# Patient Record
Sex: Male | Born: 1985 | Race: Black or African American | Hispanic: No | Marital: Single | Smoking: Never smoker
Health system: Southern US, Community
[De-identification: ages and names within clinical notes are randomized; demographics above are authoritative.]

---
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
## 2007-01-25 ENCOUNTER — Emergency Department (HOSPITAL_COMMUNITY): Admission: EM | Admit: 2007-01-25 | Discharge: 2007-01-25 | Payer: Self-pay | Admitting: Emergency Medicine

## 2010-09-28 LAB — URINALYSIS, ROUTINE W REFLEX MICROSCOPIC
Bilirubin Urine: NEGATIVE
Glucose, UA: NEGATIVE
Ketones, ur: NEGATIVE
Leukocytes, UA: NEGATIVE
Nitrite: NEGATIVE
Specific Gravity, Urine: 1.02
Urobilinogen, UA: 0.2
pH: 6

## 2010-09-28 LAB — URINE MICROSCOPIC-ADD ON

## 2010-09-28 LAB — BASIC METABOLIC PANEL
BUN: 18
CO2: 27
Chloride: 99
Glucose, Bld: 110 — ABNORMAL HIGH
Potassium: 3.5

## 2010-09-28 LAB — CBC
HCT: 43.1
MCHC: 33.7
MCV: 90.9
Platelets: 240
RDW: 13.8

## 2010-09-28 LAB — DIFFERENTIAL
Basophils Absolute: 0
Eosinophils Absolute: 0
Eosinophils Relative: 0
Lymphs Abs: 1.5

## 2014-10-29 ENCOUNTER — Emergency Department (HOSPITAL_COMMUNITY): Payer: BLUE CROSS/BLUE SHIELD

## 2014-10-29 ENCOUNTER — Emergency Department (HOSPITAL_COMMUNITY)
Admission: EM | Admit: 2014-10-29 | Discharge: 2014-10-29 | Disposition: A | Discharge status: Home / Self Care | Payer: BLUE CROSS/BLUE SHIELD | Attending: Emergency Medicine | Admitting: Emergency Medicine

## 2014-10-29 ENCOUNTER — Encounter (HOSPITAL_COMMUNITY): Payer: Self-pay | Admitting: *Deleted

## 2014-10-29 DIAGNOSIS — S62633A Displaced fracture of distal phalanx of left middle finger, initial encounter for closed fracture: Secondary | ICD-10-CM | POA: Insufficient documentation

## 2014-10-29 DIAGNOSIS — S61315A Laceration without foreign body of left ring finger with damage to nail, initial encounter: Secondary | ICD-10-CM | POA: Insufficient documentation

## 2014-10-29 DIAGNOSIS — Y998 Other external cause status: Secondary | ICD-10-CM | POA: Diagnosis not present

## 2014-10-29 DIAGNOSIS — Y9389 Activity, other specified: Secondary | ICD-10-CM | POA: Insufficient documentation

## 2014-10-29 DIAGNOSIS — S62635B Displaced fracture of distal phalanx of left ring finger, initial encounter for open fracture: Secondary | ICD-10-CM | POA: Insufficient documentation

## 2014-10-29 DIAGNOSIS — W312XXA Contact with powered woodworking and forming machines, initial encounter: Secondary | ICD-10-CM | POA: Diagnosis not present

## 2014-10-29 DIAGNOSIS — Y9289 Other specified places as the place of occurrence of the external cause: Secondary | ICD-10-CM | POA: Insufficient documentation

## 2014-10-29 DIAGNOSIS — S6992XA Unspecified injury of left wrist, hand and finger(s), initial encounter: Secondary | ICD-10-CM | POA: Diagnosis present

## 2014-10-29 DIAGNOSIS — S62639B Displaced fracture of distal phalanx of unspecified finger, initial encounter for open fracture: Secondary | ICD-10-CM

## 2014-10-29 MED ORDER — CEPHALEXIN 500 MG PO CAPS: 500.0000 mg | ORAL_CAPSULE | Freq: Four times a day (QID) | ORAL | Status: DC

## 2014-10-29 MED ORDER — CEPHALEXIN 500 MG PO CAPS
500.0000 mg | ORAL_CAPSULE | Freq: Once | ORAL | Status: AC
  Administered 2014-10-29: 500 mg via ORAL
  Filled 2014-10-29: qty 1

## 2014-10-29 MED ORDER — OXYCODONE-ACETAMINOPHEN 5-325 MG PO TABS
1.0000 | ORAL_TABLET | Freq: Once | ORAL | Status: AC
  Administered 2014-10-29: 1 via ORAL

## 2014-10-29 MED ORDER — OXYCODONE-ACETAMINOPHEN 5-325 MG PO TABS: 1.0000 | ORAL_TABLET | ORAL | Status: DC | PRN

## 2014-10-29 MED ORDER — LIDOCAINE HCL (PF) 2 % IJ SOLN
INTRAMUSCULAR | Status: AC
  Filled 2014-10-29: qty 10

## 2014-10-29 MED ORDER — OXYCODONE-ACETAMINOPHEN 5-325 MG PO TABS
ORAL_TABLET | ORAL | Status: AC
  Filled 2014-10-29: qty 1

## 2014-10-29 NOTE — ED Provider Notes (Signed)
CSN: [id number]     Arrival date & time 10/29/14  1059 History   First MD Initiated Contact with Patient 10/29/14 1114     Chief Complaint  Patient presents with  . Extremity Laceration     (Consider location/radiation/quality/duration/timing/severity/associated sxs/prior Treatment) HPI   Mitchell Hopkins is a 29 y.o. male who presents to the Emergency Department complaining of laceration and crush injury to the left ring finger that occurred shortly prior to ED arrival.  He states that he was using a wood splitter at the time of the injury.  Nothing makes his pain better or worse.  He reports mild bleeding that is controlled with pressure.  He denies swelling, numbness, and pain to the other fingers or wrist.  Last Td is < 5 yrs ago   History reviewed. No pertinent past medical history. History reviewed. No pertinent past surgical history. History reviewed. No pertinent family history. Social History  Substance Use Topics  . Smoking status: Never Smoker   . Smokeless tobacco: None  . Alcohol Use: No    Review of Systems  Constitutional: Negative for fever and chills.  Musculoskeletal: Negative for back pain, joint swelling and arthralgias.  Skin: Positive for wound (left ring finger laceration).       Laceration   Neurological: Negative for dizziness, weakness and numbness.  Hematological: Does not bruise/bleed easily.  All other systems reviewed and are negative.     Allergies  Review of patient's allergies indicates no known allergies.  Home Medications   Prior to Admission medications   Not on File   BP 157/76 mmHg  Pulse 83  Temp(Src) 97.6 F (36.4 C) (Oral)  Resp 18  Ht 5\' 9"  (1.753 m)  Wt 230 lb (104.327 kg)  BMI 33.95 kg/m2  SpO2 98% Physical Exam  Constitutional: He is oriented to person, place, and time. He appears well-developed and well-nourished. No distress.  HENT:  Head: Normocephalic and atraumatic.  Cardiovascular: Normal rate, regular  rhythm, normal heart sounds and intact distal pulses.   No murmur heard. Pulmonary/Chest: Effort normal and breath sounds normal. No respiratory distress.  Musculoskeletal: He exhibits no edema.       Left hand: He exhibits tenderness and laceration. He exhibits normal range of motion, normal capillary refill and no swelling. Normal sensation noted. Normal strength noted. He exhibits no finger abduction and no thumb/finger opposition.       Hands: Laceration to distal left ring finger, nail bed intact, but proximal nail is avulsed.  Bleeding controlled.  Distal sensation intact, Cap refill < 2 sec.  DIP joint is NT  Neurological: He is alert and oriented to person, place, and time. He exhibits normal muscle tone. Coordination normal.  Skin: Skin is warm. Laceration noted.  Nursing note and vitals reviewed.   ED Course  Procedures (including critical care time) Labs Review Labs Reviewed - No data to display  Imaging Review Dg Finger Middle Left  10/29/2014  CLINICAL DATA:  Left ring finger laceration. EXAM: LEFT MIDDLE FINGER 2+V COMPARISON:  None. FINDINGS: Examination demonstrates suggestion of a subtle fracture involving the distal tuft of the middle finger. Mild adjacent soft tissue swelling. Also evidence of a displaced transverse fracture of the distal phalanx of the ring finger. IMPRESSION: Displaced transverse fracture of the distal phalanx of the ring finger. Subtle fracture of the distal tuft of the middle finger. Electronically Signed   By: Elberta Fortisaniel  Boyle M.D.   On: 10/29/2014 12:08   Dg Finger Ring  Left  10/29/2014  CLINICAL DATA:  Laceration LEFT ring finger, cut finger while using a wood splinter EXAM: LEFT RING FINGER 2+V COMPARISON:  None FINDINGS: Soft tissue deformity distal phalanx. Comminuted displaced tuft fracture distal phalanx LEFT ring finger. Osseous mineralization normal. Joint spaces preserved. No extension of fracture to DIP joint. No additional fracture,  dislocation or bone destruction. IMPRESSION: Displaced tuft fracture distal phalanx LEFT ring finger. Electronically Signed   By: Ulyses Southward M.D.   On: 10/29/2014 12:05   I have personally reviewed and evaluated these images and lab results as part of my medical decision-making.    LACERATION REPAIR Performed by: Holt Woolbright L. Authorized by: Maxwell Caul Consent: Verbal consent obtained. Risks and benefits: risks, benefits and alternatives were discussed Consent given by: patient Patient identity confirmed: provided demographic data Prepped and Draped in normal sterile fashion Wound explored  Laceration Location: left ring finger  Laceration Length: 2 cm  No Foreign Bodies seen or palpated  Anesthesia: digital block  Local anesthetic: lidocaine 2% w/o epinephrine  Anesthetic total: 2 ml  Irrigation method: syringe Amount of cleaning: standard  Skin closure: 4-0 ethilon  Number of sutures: 5  Technique: simple interrupted  Patient tolerance: Patient tolerated the procedure well with no immediate complications.   Proximal nail was placed in anatomic position and held in place with one simple interrupted suture through the nail bed  MDM   Final diagnoses:  Open fracture of tuft of distal phalanx of finger, initial encounter    1310  Consulted Dr. Romeo Apple.  Will see pt in his offfice for f/u.    Finger was bandaged, splinted and buddy taped to middle finger.  Pain improved, remains NV intact.  Pt agrees to f/u with Dr. Romeo Apple in his office.  Given strict return precautions.  rx for percocet and keflex    Pauline Aus, PA-C 10/31/14 1700  Jerelyn Scott, MD 11/08/14 2302

## 2014-10-29 NOTE — ED Notes (Signed)
Left ring finger laceration, cut finger while using wood spliter

## 2014-10-29 NOTE — Discharge Instructions (Signed)
Finger Fracture °Finger fractures are breaks in the bones of the fingers. There are many types of fractures. There are also different ways of treating these fractures. Your doctor will talk with you about the best way to treat your fracture. °Injury is the main cause of broken fingers. This includes: °· Injuries while playing sports. °· Workplace injuries. °· Falls. °HOME CARE °· Follow your doctor's instructions for: °¨ Activities. °¨ Exercises. °¨ Physical therapy. °· Take medicines only as told by your doctor for pain, discomfort, or fever. °GET HELP IF: °You have pain or swelling that limits: °· The motion of your fingers. °· The use of your fingers. °GET HELP RIGHT AWAY IF: °· You cannot feel your fingers, or your fingers become numb. °  °This information is not intended to replace advice given to you by your health care provider. Make sure you discuss any questions you have with your health care provider. °  °Document Released: 06/12/2007 Document Revised: 01/14/2014 Document Reviewed: 08/05/2012 °Elsevier Interactive Patient Education ©2016 [employer] ° °

## 2014-11-01 ENCOUNTER — Ambulatory Visit (INDEPENDENT_AMBULATORY_CARE_PROVIDER_SITE_OTHER): Payer: BLUE CROSS/BLUE SHIELD | Admitting: Orthopedic Surgery

## 2014-11-01 ENCOUNTER — Encounter: Payer: Self-pay | Admitting: Orthopedic Surgery

## 2014-11-01 VITALS — BP 155/94 | Ht 69.0 in | Wt 230.0 lb

## 2014-11-01 DIAGNOSIS — S62635A Displaced fracture of distal phalanx of left ring finger, initial encounter for closed fracture: Secondary | ICD-10-CM

## 2014-11-01 DIAGNOSIS — S62633A Displaced fracture of distal phalanx of left middle finger, initial encounter for closed fracture: Secondary | ICD-10-CM
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
## 2014-11-07 ENCOUNTER — Encounter: Payer: Self-pay | Admitting: Orthopedic Surgery

## 2014-11-07 ENCOUNTER — Ambulatory Visit (INDEPENDENT_AMBULATORY_CARE_PROVIDER_SITE_OTHER): Payer: Self-pay | Admitting: Orthopedic Surgery

## 2014-11-07 VITALS — BP 158/99 | Ht 69.0 in | Wt 230.0 lb

## 2014-11-07 DIAGNOSIS — S62635D Displaced fracture of distal phalanx of left ring finger, subsequent encounter for fracture with routine healing: Secondary | ICD-10-CM

## 2014-11-07 DIAGNOSIS — S62633D Displaced fracture of distal phalanx of left middle finger, subsequent encounter for fracture with routine healing: Secondary | ICD-10-CM

## 2014-11-07 NOTE — Progress Notes (Signed)
Patient ID: Mitchell DissChristopher J Hopkins, male   DOB: 01/25/1985, 29 y.o.   MRN: [id number]  Follow up visit  Chief Complaint  Patient presents with  . Follow-up    6 day follow up suture removal left long finger, DOI 10/29/14    BP 158/99 mmHg  Ht 5\' 9"  (1.753 m)  Wt 230 lb (104.327 kg)  BMI 33.95 kg/m2  Encounter Diagnoses  Name Primary?  . Closed displaced fracture of distal phalanx of left ring finger with routine healing, subsequent encounter Yes  . Closed displaced fracture of distal phalanx of left middle finger with routine healing, subsequent encounter      The patient is left long finger tuft fracture we took his sutures out today. Everything looks good we advised him to soak the finger at night 20 minutes at a time warm water, Epson salt, 1 dropped his detergent, return in 2 weeks

## 2014-11-07 NOTE — Patient Instructions (Signed)
Daily soaks for 20 minutes in warm salt water with a drop of dish detergent

## 2014-11-21 ENCOUNTER — Ambulatory Visit (INDEPENDENT_AMBULATORY_CARE_PROVIDER_SITE_OTHER): Payer: Self-pay | Admitting: Orthopedic Surgery

## 2014-11-21 VITALS — BP 154/101 | Ht 69.0 in | Wt 230.0 lb

## 2014-11-21 DIAGNOSIS — S62635D Displaced fracture of distal phalanx of left ring finger, subsequent encounter for fracture with routine healing: Secondary | ICD-10-CM
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
## 2015-08-27 ENCOUNTER — Emergency Department (HOSPITAL_COMMUNITY): Payer: BLUE CROSS/BLUE SHIELD

## 2015-08-27 ENCOUNTER — Encounter (HOSPITAL_COMMUNITY): Payer: Self-pay

## 2015-08-27 ENCOUNTER — Emergency Department (HOSPITAL_COMMUNITY)
Admission: EM | Admit: 2015-08-27 | Discharge: 2015-08-28 | Disposition: A | Discharge status: Home / Self Care | Payer: BLUE CROSS/BLUE SHIELD | Attending: Emergency Medicine | Admitting: Emergency Medicine

## 2015-08-27 DIAGNOSIS — S90811A Abrasion, right foot, initial encounter: Secondary | ICD-10-CM | POA: Insufficient documentation

## 2015-08-27 DIAGNOSIS — S60512A Abrasion of left hand, initial encounter: Secondary | ICD-10-CM | POA: Diagnosis not present

## 2015-08-27 DIAGNOSIS — Z23 Encounter for immunization: Secondary | ICD-10-CM | POA: Diagnosis not present

## 2015-08-27 DIAGNOSIS — K449 Diaphragmatic hernia without obstruction or gangrene: Secondary | ICD-10-CM | POA: Insufficient documentation

## 2015-08-27 DIAGNOSIS — Y999 Unspecified external cause status: Secondary | ICD-10-CM | POA: Diagnosis not present

## 2015-08-27 DIAGNOSIS — S6991XA Unspecified injury of right wrist, hand and finger(s), initial encounter: Secondary | ICD-10-CM | POA: Diagnosis present

## 2015-08-27 DIAGNOSIS — S30810A Abrasion of lower back and pelvis, initial encounter: Secondary | ICD-10-CM | POA: Insufficient documentation

## 2015-08-27 DIAGNOSIS — Y9355 Activity, bike riding: Secondary | ICD-10-CM | POA: Diagnosis not present

## 2015-08-27 DIAGNOSIS — S92332A Displaced fracture of third metatarsal bone, left foot, initial encounter for closed fracture: Secondary | ICD-10-CM | POA: Insufficient documentation

## 2015-08-27 DIAGNOSIS — S93125A Dislocation of metatarsophalangeal joint of left lesser toe(s), initial encounter: Secondary | ICD-10-CM | POA: Insufficient documentation

## 2015-08-27 DIAGNOSIS — Y92410 Unspecified street and highway as the place of occurrence of the external cause: Secondary | ICD-10-CM | POA: Diagnosis not present

## 2015-08-27 DIAGNOSIS — S92322A Displaced fracture of second metatarsal bone, left foot, initial encounter for closed fracture: Secondary | ICD-10-CM | POA: Insufficient documentation

## 2015-08-27 DIAGNOSIS — T148XXA Other injury of unspecified body region, initial encounter: Secondary | ICD-10-CM

## 2015-08-27 DIAGNOSIS — S52591A Other fractures of lower end of right radius, initial encounter for closed fracture: Secondary | ICD-10-CM | POA: Diagnosis not present

## 2015-08-27 DIAGNOSIS — S92342A Displaced fracture of fourth metatarsal bone, left foot, initial encounter for closed fracture: Secondary | ICD-10-CM | POA: Insufficient documentation

## 2015-08-27 LAB — I-STAT CHEM 8, ED
BUN: 25 mg/dL — ABNORMAL HIGH (ref 6–20)
CREATININE: 1.4 mg/dL — AB (ref 0.61–1.24)
Calcium, Ion: 1.21 mmol/L (ref 1.13–1.30)
Chloride: 105 mmol/L (ref 101–111)
Glucose, Bld: 173 mg/dL — ABNORMAL HIGH (ref 65–99)
HEMATOCRIT: 48 % (ref 39.0–52.0)
HEMOGLOBIN: 16.3 g/dL (ref 13.0–17.0)
POTASSIUM: 3.8 mmol/L (ref 3.5–5.1)
Sodium: 141 mmol/L (ref 135–145)
TCO2: 24 mmol/L (ref 0–100)

## 2015-08-27 MED ORDER — SODIUM CHLORIDE 0.9 % IV BOLUS (SEPSIS)
1000.0000 mL | Freq: Once | INTRAVENOUS | Status: AC
  Administered 2015-08-27: 1000 mL via INTRAVENOUS

## 2015-08-27 MED ORDER — LIDOCAINE HCL (PF) 2 % IJ SOLN
INTRAMUSCULAR | Status: DC
Start: 2015-08-27 — End: 2015-08-28
  Filled 2015-08-27: qty 10
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
## 2015-08-28 DIAGNOSIS — S52591A Other fractures of lower end of right radius, initial encounter for closed fracture: Secondary | ICD-10-CM | POA: Diagnosis not present

## 2015-08-28 MED ORDER — IOPAMIDOL (ISOVUE-300) INJECTION 61%
100.0000 mL | Freq: Once | INTRAVENOUS | Status: AC | PRN
  Administered 2015-08-28: 100 mL via INTRAVENOUS

## 2015-08-28 MED ORDER — TRAMADOL HCL 50 MG PO TABS: 50.0000 mg | ORAL_TABLET | Freq: Four times a day (QID) | ORAL | 0 refills | Status: DC | PRN

## 2015-08-28 MED ORDER — TETANUS-DIPHTH-ACELL PERTUSSIS 5-2.5-18.5 LF-MCG/0.5 IM SUSP
0.5000 mL | Freq: Once | INTRAMUSCULAR | Status: AC
  Administered 2015-08-28: 0.5 mL via INTRAMUSCULAR
  Filled 2015-08-28: qty 0.5

## 2015-08-28 MED ORDER — FENTANYL CITRATE (PF) 100 MCG/2ML IJ SOLN
50.0000 ug | Freq: Once | INTRAMUSCULAR | Status: AC
  Administered 2015-08-28: 50 ug via NASAL
  Filled 2015-08-28: qty 2

## 2015-08-28 NOTE — Discharge Instructions (Signed)
Follow-up with [facility] [place] this week. Or he can call your orthopedic doctor Dr. Romeo AppleHarrison to see this week
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
## 2015-08-29 ENCOUNTER — Encounter: Payer: Self-pay | Admitting: Orthopaedic Surgery

## 2015-08-29 ENCOUNTER — Ambulatory Visit (INDEPENDENT_AMBULATORY_CARE_PROVIDER_SITE_OTHER): Payer: BLUE CROSS/BLUE SHIELD | Admitting: Orthopaedic Surgery

## 2015-08-29 ENCOUNTER — Ambulatory Visit (INDEPENDENT_AMBULATORY_CARE_PROVIDER_SITE_OTHER): Payer: BLUE CROSS/BLUE SHIELD

## 2015-08-29 VITALS — BP 140/91 | HR 103 | Temp 97.3°F

## 2015-08-29 DIAGNOSIS — T07XXXA Unspecified multiple injuries, initial encounter: Secondary | ICD-10-CM

## 2015-08-29 DIAGNOSIS — S92302A Fracture of unspecified metatarsal bone(s), left foot, initial encounter for closed fracture: Secondary | ICD-10-CM | POA: Diagnosis not present

## 2015-08-29 DIAGNOSIS — S52501A Unspecified fracture of the lower end of right radius, initial encounter for closed fracture: Secondary | ICD-10-CM

## 2015-08-29 DIAGNOSIS — T148 Other injury of unspecified body region: Secondary | ICD-10-CM

## 2015-08-29 MED ORDER — HYDROCODONE-ACETAMINOPHEN 7.5-325 MG PO TABS: ORAL_TABLET | ORAL | 0 refills | Status: DC
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
## 2015-08-30 ENCOUNTER — Other Ambulatory Visit: Payer: Self-pay | Admitting: *Deleted

## 2015-08-30 ENCOUNTER — Ambulatory Visit (HOSPITAL_COMMUNITY): Payer: BLUE CROSS/BLUE SHIELD | Attending: Orthopaedic Surgery | Admitting: Physical Therapy

## 2015-08-30 DIAGNOSIS — M25622 Stiffness of left elbow, not elsewhere classified: Secondary | ICD-10-CM | POA: Diagnosis present

## 2015-08-30 DIAGNOSIS — M545 Low back pain, unspecified: Secondary | ICD-10-CM

## 2015-08-30 DIAGNOSIS — S81021D Laceration with foreign body, right knee, subsequent encounter: Secondary | ICD-10-CM | POA: Diagnosis present

## 2015-08-30 DIAGNOSIS — S81022D Laceration with foreign body, left knee, subsequent encounter: Secondary | ICD-10-CM | POA: Insufficient documentation

## 2015-08-30 DIAGNOSIS — S31000D Unspecified open wound of lower back and pelvis without penetration into retroperitoneum, subsequent encounter: Secondary | ICD-10-CM | POA: Diagnosis present

## 2015-08-30 DIAGNOSIS — M25641 Stiffness of right hand, not elsewhere classified: Secondary | ICD-10-CM | POA: Diagnosis present

## 2015-08-30 DIAGNOSIS — S51021D Laceration with foreign body of right elbow, subsequent encounter: Secondary | ICD-10-CM | POA: Diagnosis present

## 2015-08-30 DIAGNOSIS — M79631 Pain in right forearm: Secondary | ICD-10-CM | POA: Insufficient documentation

## 2015-08-30 DIAGNOSIS — M79632 Pain in left forearm: Secondary | ICD-10-CM | POA: Diagnosis present

## 2015-08-30 DIAGNOSIS — X58XXXD Exposure to other specified factors, subsequent encounter: Secondary | ICD-10-CM | POA: Diagnosis not present

## 2015-08-30 DIAGNOSIS — T07XXXA Unspecified multiple injuries, initial encounter: Secondary | ICD-10-CM

## 2015-08-31 ENCOUNTER — Telehealth (HOSPITAL_COMMUNITY): Payer: Self-pay | Admitting: Physical Therapy
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
## 2015-08-31 NOTE — Telephone Encounter (Signed)
Pt agreed to come in early today. NF °

## 2015-09-01 ENCOUNTER — Ambulatory Visit (HOSPITAL_COMMUNITY): Payer: BLUE CROSS/BLUE SHIELD | Admitting: Physical Therapy

## 2015-09-01 DIAGNOSIS — S81022D Laceration with foreign body, left knee, subsequent encounter: Secondary | ICD-10-CM

## 2015-09-01 DIAGNOSIS — M545 Low back pain, unspecified: Secondary | ICD-10-CM

## 2015-09-01 DIAGNOSIS — M25641 Stiffness of right hand, not elsewhere classified: Secondary | ICD-10-CM

## 2015-09-01 DIAGNOSIS — S31000D Unspecified open wound of lower back and pelvis without penetration into retroperitoneum, subsequent encounter: Secondary | ICD-10-CM

## 2015-09-01 DIAGNOSIS — M79631 Pain in right forearm: Secondary | ICD-10-CM

## 2015-09-01 DIAGNOSIS — S81021D Laceration with foreign body, right knee, subsequent encounter: Secondary | ICD-10-CM

## 2015-09-01 DIAGNOSIS — M79632 Pain in left forearm: Secondary | ICD-10-CM

## 2015-09-01 DIAGNOSIS — M25622 Stiffness of left elbow, not elsewhere classified: Secondary | ICD-10-CM

## 2015-09-01 DIAGNOSIS — S51021D Laceration with foreign body of right elbow, subsequent encounter: Secondary | ICD-10-CM

## 2015-09-01 NOTE — Telephone Encounter (Signed)
Called pt to have him come in for treatment today. Virgina Organynthia Russell, PT CLT [phone]

## 2015-09-01 NOTE — Therapy (Signed)
[place] [facility] [address] [place], [place], [address] Phone: [phone]   Fax:  [phone]  Wound Care Therapy  Patient Details  Name: Mitchell DissChristopher J Hopkins MRN: [id number] Date of Birth: 07/03/1985 Referring Provider: Dr. Darreld McleanWayne Keeling   Encounter Date: 09/01/2015      PT End of Session - 09/01/15 1446    Visit Number 2   Number of Visits 10   Date for PT Re-Evaluation 09/30/15   Authorization Type [employer]   Authorization - Visit Number 2   Authorization - Number of Visits 10   PT Start Time 1000   PT Stop Time 1210   PT Time Calculation (min) 130 min   Activity Tolerance Patient tolerated treatment well   Behavior During Therapy [facility] AWFL for tasks assessed/performed      No past medical history on file.  No past surgical history on file.  There were no vitals filed for this visit.                  Wound Therapy - 09/01/15 1241    Subjective Mr. Mitchell FoilBass states that he had excessive drainage from his back.  Pt unable to sleep at night.   Patient and Family Stated Goals Wounds to heal    Date of Onset 08/28/15   Prior Treatments ER, MD office    Pain Assessment 0-10   Pain Score 5    Pain Type Acute pain   Pain Location Generalized   Pain Orientation --  at wound sites    Pain Descriptors / Indicators Burning;Discomfort   Multiple Pain Sites Yes  at all wound sites and Lt foot, Rt wrist    Wound Properties Date First Assessed: 08/30/15 Time First Assessed: 1511 Wound Type: Degloving Location: Back Location Orientation: Lower;Right Present on Admission: Yes   Dressing Type Gauze (Comment);Impregnated gauze (petrolatum);Silver hydrofiber   Dressing Changed Changed   Dressing Status None   Dressing Change Frequency PRN   Site / Wound Assessment Friable;Painful;Red;Yellow   % Wound base Red or Granulating 60%   % Wound base Yellow 40%   Peri-wound Assessment Edema   Drainage Amount Moderate   Drainage Description  Purulent;Odor   Treatment Cleansed;Debridement (Selective)   Wound Properties Date First Assessed: 08/30/15 Time First Assessed: 1519 Wound Type: Degloving Location: Back Location Orientation: Left;Lower   Dressing Type Gauze (Comment);Impregnated gauze (petrolatum);Silver hydrofiber   Dressing Changed Changed   Dressing Status None   Dressing Change Frequency PRN   Site / Wound Assessment Friable;Granulation tissue;Painful   % Wound base Red or Granulating 80%   % Wound base Yellow 20%   Peri-wound Assessment Edema   Margins Attached edges (approximated)   Drainage Amount Minimal   Drainage Description Serous   Treatment Cleansed;Debridement (Selective)   Wound Properties Date First Assessed: 08/30/15 Time First Assessed: 1534 Wound Type: Degloving Location: Arm Location Orientation: Left;Lateral Present on Admission: Yes   Dressing Type Gauze (Comment);Impregnated gauze (bismuth)   Dressing Changed Changed   Dressing Status Old drainage   Dressing Change Frequency PRN   Site / Wound Assessment Friable;Granulation tissue;Painful;Yellow   % Wound base Red or Granulating 70%   % Wound base Yellow 30%   Drainage Amount Minimal   Drainage Description Serous   Treatment Cleansed;Debridement (Selective)   Wound Properties Date First Assessed: 08/30/15 Wound Type: Degloving Location: Wrist Location Orientation: Left;Lateral   Dressing Type Gauze (Comment);Impregnated gauze (bismuth)   Dressing Changed Changed   Dressing Status Old drainage  Dressing Change Frequency PRN   % Wound base Red or Granulating 80%   % Wound base Yellow 20%   Margins Attached edges (approximated)   Drainage Amount Minimal   Drainage Description Serous   Treatment Cleansed;Debridement (Selective)   Wound Properties Date First Assessed: 08/30/15 Time First Assessed: 1513 Wound Type: Degloving Location: Wrist Location Orientation: Left;Medial   Dressing Type Gauze (Comment);Impregnated gauze (bismuth)    Dressing Changed Changed   Dressing Status Old drainage   Dressing Change Frequency PRN   Site / Wound Assessment Granulation tissue;Painful;Red   Drainage Amount Scant   Drainage Description Serous   Treatment Cleansed;Debridement (Selective)   Wound Properties Date First Assessed: 08/30/15 Time First Assessed: 1555 Wound Type: Degloving Location: Hand Location Orientation: Left Wound Description (Comments): dorsal aspect  Present on Admission: Yes   Dressing Type Gauze (Comment);Impregnated gauze (bismuth)   Dressing Changed Changed   Dressing Status Old drainage   Dressing Change Frequency PRN   % Wound base Red or Granulating 70%   % Wound base Yellow 30%   Peri-wound Assessment Edema   Drainage Amount Minimal   Drainage Description Serous   Treatment Cleansed;Debridement (Selective)   Wound Properties Date First Assessed: 08/30/15 Time First Assessed: 1558 Wound Type: Degloving Location: Arm Location Orientation: Left Wound Description (Comments): Lt palmer forearm  Present on Admission: Yes   Dressing Type Gauze (Comment);Impregnated gauze (bismuth)   Dressing Changed Changed   Dressing Status Old drainage   Dressing Change Frequency PRN   % Wound base Red or Granulating 85%   % Wound base Yellow 15%   Peri-wound Assessment Edema   Drainage Amount Scant   Drainage Description Serous   Treatment Cleansed;Debridement (Selective)   Wound Properties Date First Assessed: 08/30/15 Time First Assessed: 1559 Wound Type: Degloving Location: Wrist Location Orientation: Left Wound Description (Comments): volar aspect of wrist and thenar eminence of hand    Dressing Type Gauze (Comment);Impregnated gauze (bismuth)   Dressing Changed Changed   Dressing Status Old drainage   Dressing Change Frequency PRN   Site / Wound Assessment Granulation tissue;Painful;Red;Yellow   % Wound base Red or Granulating 90%   % Wound base Yellow 10%   Peri-wound Assessment Edema   Drainage Amount Scant    Drainage Description Serous   Treatment Cleansed;Debridement (Selective)   Wound Properties Date First Assessed: 08/30/15 Time First Assessed: 1610 Wound Type: Degloving Location: Knee Location Orientation: Left;Anterior Present on Admission: Yes   Dressing Type Gauze (Comment);Impregnated gauze (bismuth);Silver hydrofiber   Dressing Changed Changed   Dressing Status Old drainage   Dressing Change Frequency PRN   Site / Wound Assessment Painful;Red;Yellow   % Wound base Red or Granulating 65%   % Wound base Yellow 35%   Drainage Amount Moderate   Drainage Description Purulent   Treatment Cleansed;Debridement (Selective)   Wound Properties Date First Assessed: 08/30/15 Wound Type: Degloving Location: Elbow Location Orientation: Right Wound Description (Comments): has a hole in the center that his .5 cm deep   Dressing Type Gauze (Comment);Silver hydrofiber   Dressing Changed Changed   Dressing Status Old drainage   Site / Wound Assessment Other (Comment)  grey   % Wound base Red or Granulating 0%   % Wound base Other (Comment) 100%  grey   Drainage Amount Moderate   Drainage Description Serous   Treatment Cleansed;Debridement (Selective)   Wound Properties Date First Assessed: 08/30/15 Time First Assessed: 1616 Wound Type: Degloving Location: Arm Location Orientation: Right Wound Description (Comments): forearm  inferior to elbow    Dressing Type Gauze (Comment);Impregnated gauze (bismuth)   Dressing Changed Changed   Dressing Status Old drainage   Dressing Change Frequency PRN   Site / Wound Assessment Granulation tissue   % Wound base Red or Granulating 80%   % Wound base Yellow 20%   Peri-wound Assessment Edema   Drainage Amount Minimal   Drainage Description Serous   Treatment Cleansed;Debridement (Selective)   Wound Properties Date First Assessed: 08/30/15 Time First Assessed: 1610 Wound Type: Degloving Location: Arm Location Orientation: Right;Medial Wound Description  (Comments): forearm Present on Admission: Yes   Dressing Type Gauze (Comment);Impregnated gauze (bismuth)   Dressing Changed Changed   Dressing Status Old drainage   Dressing Change Frequency PRN   Site / Wound Assessment Granulation tissue;Painful   % Wound base Red or Granulating 95%   % Wound base Yellow 5%   Peri-wound Assessment Edema   Drainage Amount Scant   Drainage Description Serous   Wound Properties Date First Assessed: 08/30/15 Time First Assessed: 1621 Wound Type: Degloving Location: Hand Location Orientation: Right Wound Description (Comments): dorsal aspect    % Wound base Red or Granulating 100%   Drainage Amount Scant   Treatment Cleansed   Wound Properties Date First Assessed: 08/30/15 Time First Assessed: 1642 Wound Type: Degloving Location: Knee Location Orientation: Right   Dressing Type Gauze (Comment);Impregnated gauze (bismuth)   Dressing Changed Changed   Dressing Status Old drainage   Dressing Change Frequency PRN   Site / Wound Assessment Painful;Red;Yellow   % Wound base Red or Granulating 60%   % Wound base Yellow 40%   Drainage Amount Moderate   Drainage Description Purulent   Treatment Cleansed;Debridement (Selective)   Selective Debridement - Location --  wound sites   Selective Debridement - Tools Used Forceps;Scalpel;Scissors   Selective Debridement - Tissue Removed slough, biofilm   slough, asphalt   Wound Therapy - Clinical Statement Overall wounds are improving but continue to drain with slough on the majority of the wounds.  Rt elbow wounds appears the worst and appears to have asphault still imbedded in the center.    Wound Therapy - Functional Problem List difficulty walking, difficulty sleeping    Factors Delaying/Impairing Wound Healing Infection - systemic/local   Hydrotherapy Plan Debridement;Dressing change;Patient/family education   Wound Therapy - Frequency 3X / week  for 2 weeks then decrease to 2x a week for 2 weeks.   Wound  Therapy - Current Recommendations PT   Wound Plan Pt to be seen for cleansing of his wounds as well as debridement and dressing changes.    Dressing  used silver hydrofiber, xeroform,vaseline,  4x4, kerlix, netting.                 PT Education - 08/31/15 1458    Education provided Yes   Education Details To take dressings off if they are wet from drainage, cleans with dial soap and redress keeping wounds moist but not wet.  The importance of maintaining ROM of knee, elbow and hand even though it is painful to move at this time.    Person(s) Educated Patient;Parent(s)   Methods Explanation   Comprehension Verbalized understanding;Returned demonstration          PT Short Term Goals - 09/01/15 1452      PT SHORT TERM GOAL #1   Title Pt overall pain level to be no greater than 4/10 to allow pt to don and doff shirts with ease.  Time 1   Period Weeks   Status On-going     PT SHORT TERM GOAL #2   Title Pt drainage of wounds to be scant to decrease risk of infection    Time 2   Period Weeks   Status On-going     PT SHORT TERM GOAL #3   Title Pt to be completing ROM for  bilateral UE and LE and trunk to prevent stiffness of joints and decrease edema.    Time 2   Period Weeks   Status On-going           PT Long Term Goals - 09/01/15 1452      PT LONG TERM GOAL #1   Title Pt pain to be no greater than a 2/10 to feel comfortable driving on his own   Time 4   Period Weeks   Status On-going     PT LONG TERM GOAL #2   Title Pt wounds to have no drainage to decrease risk of infecton    Time 4   Period Weeks   Status On-going     PT LONG TERM GOAL #3   Title Pt to be able to walk for 15 mintues without stopping    Time 4   Period Weeks   Status On-going     PT LONG TERM GOAL #4   Title Pt wound sizes to be decreased by 50% to allow patient and family to feel comfortable with self care of his wounds.    Time 4   Period Weeks   Status On-going                Plan - 09/01/15 1452    Clinical Impression Statement see above    Rehab Potential Good   PT Frequency 3x / week   PT Duration 2 weeks  followed by 2x a week for 2 weeks    PT Treatment/Interventions Patient/family education;Therapeutic exercise;Other (comment);ADLs/Self Care Home Management   PT Next Visit Plan See pt for wound care       Patient will benefit from skilled therapeutic intervention in order to improve the following deficits and impairments:  Decreased activity tolerance, Pain, Other (comment), Difficulty walking (multiple open wounds )  Visit Diagnosis: Bilateral low back pain without sciatica  Pain in right forearm  Pain in left forearm  Stiffness of left elbow, not elsewhere classified  Stiffness of right hand, not elsewhere classified  Laceration of right knee with foreign body, subsequent encounter  Laceration of left knee with foreign body, subsequent encounter  Laceration of right elbow with foreign body, subsequent encounter  Open wound of lumbar region without complication, subsequent encounter     Problem List There are no active problems to display for this patient.   Virgina Organ, PT CLT [phone] 09/01/2015, 2:54 PM  [facility] [address] [place], [place], [address] Phone: [phone]   Fax:  [phone]  Name: Mitchell Hopkins MRN: [id number] Date of Birth: April 18, 1985

## 2015-09-04 ENCOUNTER — Ambulatory Visit (HOSPITAL_COMMUNITY): Payer: BLUE CROSS/BLUE SHIELD | Admitting: Physical Therapy

## 2015-09-04 DIAGNOSIS — S51021D Laceration with foreign body of right elbow, subsequent encounter: Secondary | ICD-10-CM

## 2015-09-04 DIAGNOSIS — M545 Low back pain, unspecified: Secondary | ICD-10-CM

## 2015-09-04 DIAGNOSIS — M79632 Pain in left forearm: Secondary | ICD-10-CM

## 2015-09-04 DIAGNOSIS — M79631 Pain in right forearm: Secondary | ICD-10-CM

## 2015-09-04 DIAGNOSIS — M25622 Stiffness of left elbow, not elsewhere classified: Secondary | ICD-10-CM

## 2015-09-04 DIAGNOSIS — M25641 Stiffness of right hand, not elsewhere classified: Secondary | ICD-10-CM

## 2015-09-04 DIAGNOSIS — S81021D Laceration with foreign body, right knee, subsequent encounter: Secondary | ICD-10-CM

## 2015-09-04 DIAGNOSIS — S31000D Unspecified open wound of lower back and pelvis without penetration into retroperitoneum, subsequent encounter: Secondary | ICD-10-CM

## 2015-09-04 DIAGNOSIS — S81022D Laceration with foreign body, left knee, subsequent encounter: Secondary | ICD-10-CM

## 2015-09-04 NOTE — Therapy (Signed)
[facility] [address] [place], [place], [address] Phone: [phone]   Fax:  [phone]  Wound Care Therapy  Patient Details  Name: Mitchell Hopkins MRN: [id number] Date of Birth: 20-Apr-1985 Referring Provider: Dr. Darreld Mclean   Encounter Date: 09/04/2015      PT End of Session - 09/04/15 1754    Visit Number 3   Number of Visits 10   Date for PT Re-Evaluation 09/30/15   Authorization Type BCBS   Authorization - Visit Number 3   Authorization - Number of Visits 10   PT Start Time 1000  PT tech removed dressings   PT Stop Time 1200   PT Time Calculation (min) 120 min   Activity Tolerance Patient tolerated treatment well   Behavior During Therapy [facility] for tasks assessed/performed      No past medical history on file.  No past surgical history on file.  There were no vitals filed for this visit.                  Wound Therapy - 09/04/15 1749    Subjective Pt states less irritation.  Mother is with him today.   Patient and Family Stated Goals Wounds to heal    Date of Onset 08/28/15   Prior Treatments ER, MD office    Pain Assessment 0-10   Pain Score 4    Pain Type Acute pain   Multiple Pain Sites Yes   Wound Properties Date First Assessed: 08/30/15 Time First Assessed: 1511 Wound Type: Degloving Location: Back Location Orientation: Lower;Right Present on Admission: Yes   Dressing Type Gauze (Comment);Impregnated gauze (petrolatum);Silver hydrofiber   Dressing Changed Changed   Dressing Status None   Dressing Change Frequency PRN   Site / Wound Assessment Friable;Painful;Red;Yellow   % Wound base Red or Granulating 65%   % Wound base Yellow 35%   Peri-wound Assessment Edema   Drainage Amount Moderate   Drainage Description Serosanguineous   Treatment Cleansed;Debridement (Selective)   Wound Properties Date First Assessed: 08/30/15 Time First Assessed: 1519 Wound Type: Degloving Location: Back  Location Orientation: Left;Lower   Dressing Type Gauze (Comment);Impregnated gauze (petrolatum);Silver hydrofiber   Dressing Changed Changed   Dressing Status None   Dressing Change Frequency PRN   Site / Wound Assessment Friable;Granulation tissue;Painful   % Wound base Red or Granulating 85%   % Wound base Yellow 15%   Peri-wound Assessment Edema   Margins Attached edges (approximated)   Drainage Amount Minimal   Drainage Description Serous   Treatment Cleansed;Debridement (Selective)   Wound Properties Date First Assessed: 08/30/15 Time First Assessed: 1534 Wound Type: Degloving Location: Arm Location Orientation: Left;Lateral Present on Admission: Yes   Dressing Type Gauze (Comment);Impregnated gauze (bismuth)   Dressing Changed Changed   Dressing Status Old drainage   Dressing Change Frequency PRN   Site / Wound Assessment Friable;Granulation tissue;Painful;Yellow   % Wound base Red or Granulating 80%   % Wound base Yellow 20%   Drainage Amount Minimal   Drainage Description Serous   Treatment Cleansed;Debridement (Selective)   Wound Properties Date First Assessed: 08/30/15 Wound Type: Degloving Location: Wrist Location Orientation: Left;Lateral   Dressing Type Gauze (Comment);Impregnated gauze (bismuth)   Dressing Changed Changed   Dressing Status Old drainage   Dressing Change Frequency PRN   % Wound base Red or Granulating 85%   % Wound base Yellow 15%   Margins Attached edges (approximated)   Drainage Amount Minimal   Drainage  Description Serous   Treatment Cleansed;Debridement (Selective)   Wound Properties Date First Assessed: 08/30/15 Time First Assessed: 1513 Wound Type: Degloving Location: Wrist Location Orientation: Left;Medial   Dressing Type Gauze (Comment);Impregnated gauze (bismuth)   Dressing Changed Changed   Dressing Status Old drainage   Dressing Change Frequency PRN   Site / Wound Assessment Granulation tissue;Painful;Red   % Wound base Red or  Granulating 100%   Drainage Amount Scant   Drainage Description Serous   Treatment Cleansed;Debridement (Selective)   Wound Properties Date First Assessed: 08/30/15 Time First Assessed: 1555 Wound Type: Degloving Location: Hand Location Orientation: Left Wound Description (Comments): dorsal aspect  Present on Admission: Yes   Dressing Type Gauze (Comment);Impregnated gauze (bismuth)   Dressing Changed Changed   Dressing Status Old drainage   Dressing Change Frequency PRN   % Wound base Red or Granulating 80%   % Wound base Yellow 20%   Peri-wound Assessment Edema   Drainage Amount Minimal   Drainage Description Serous   Treatment Cleansed;Debridement (Selective)   Wound Properties Date First Assessed: 08/30/15 Time First Assessed: 1558 Wound Type: Degloving Location: Arm Location Orientation: Left Wound Description (Comments): Lt palmer forearm  Present on Admission: Yes   Dressing Type Gauze (Comment);Impregnated gauze (bismuth)   Dressing Changed Changed   Dressing Status Old drainage   Dressing Change Frequency PRN   % Wound base Red or Granulating 90%   % Wound base Yellow 10%   Peri-wound Assessment Edema   Drainage Amount Scant   Drainage Description Serous   Treatment Cleansed;Debridement (Selective)   Wound Properties Date First Assessed: 08/30/15 Time First Assessed: 1559 Wound Type: Degloving Location: Wrist Location Orientation: Left Wound Description (Comments): volar aspect of wrist and thenar eminence of hand    Dressing Type Gauze (Comment);Impregnated gauze (bismuth)   Dressing Changed Changed   Dressing Status Old drainage   Dressing Change Frequency PRN   Site / Wound Assessment Granulation tissue;Painful;Red;Yellow   % Wound base Red or Granulating 100%   % Wound base Yellow 0%   Peri-wound Assessment Edema   Drainage Amount --   Drainage Description --   Treatment Cleansed;Debridement (Selective)   Wound Properties Date First Assessed: 08/30/15 Time First  Assessed: 1610 Wound Type: Degloving Location: Knee Location Orientation: Left;Anterior Present on Admission: Yes   Dressing Type Gauze (Comment);Impregnated gauze (bismuth);Silver hydrofiber   Dressing Changed Changed   Dressing Status Old drainage   Dressing Change Frequency PRN   Site / Wound Assessment Painful;Red;Yellow   % Wound base Red or Granulating 75%   % Wound base Yellow 25%   Drainage Amount Minimal   Drainage Description Serosanguineous   Treatment Cleansed;Debridement (Selective)   Wound Properties Date First Assessed: 08/30/15 Wound Type: Degloving Location: Elbow Location Orientation: Right Wound Description (Comments): has a hole in the center that his .5 cm deep   Dressing Type Gauze (Comment);Silver hydrofiber   Dressing Changed Changed   Dressing Status Old drainage   Site / Wound Assessment Other (Comment)  grey   % Wound base Red or Granulating 50%   % Wound base Yellow 50%   % Wound base Other (Comment) --  grey   Drainage Amount Minimal   Drainage Description Serosanguineous   Treatment Cleansed;Debridement (Selective)   Wound Properties Date First Assessed: 08/30/15 Time First Assessed: 1616 Wound Type: Degloving Location: Arm Location Orientation: Right Wound Description (Comments): forearm inferior to elbow    Dressing Type Gauze (Comment);Impregnated gauze (bismuth)   Dressing Status Old drainage  Dressing Change Frequency PRN   Site / Wound Assessment Granulation tissue   % Wound base Red or Granulating 80%   % Wound base Yellow 20%   Peri-wound Assessment Edema   Drainage Amount Minimal   Drainage Description Serous   Wound Properties Date First Assessed: 08/30/15 Time First Assessed: 1610 Wound Type: Degloving Location: Arm Location Orientation: Right;Medial Wound Description (Comments): forearm Present on Admission: Yes   Dressing Type Gauze (Comment);Impregnated gauze (bismuth)   Dressing Status Old drainage   Dressing Change Frequency PRN    Site / Wound Assessment Granulation tissue;Painful   % Wound base Red or Granulating 95%   % Wound base Yellow 5%   Peri-wound Assessment Edema   Drainage Amount Scant   Drainage Description Serous   Wound Properties Date First Assessed: 08/30/15 Time First Assessed: 1621 Wound Type: Degloving Location: Hand Location Orientation: Right Wound Description (Comments): dorsal aspect    % Wound base Red or Granulating 100%   Drainage Amount Scant   Wound Properties Date First Assessed: 08/30/15 Time First Assessed: 1642 Wound Type: Degloving Location: Knee Location Orientation: Right   Dressing Type Gauze (Comment);Impregnated gauze (bismuth)   Dressing Status Old drainage   Dressing Change Frequency PRN   Site / Wound Assessment Painful;Red;Yellow   % Wound base Red or Granulating 60%   % Wound base Yellow 40%   Drainage Amount Moderate   Drainage Description Purulent   Selective Debridement - Location --  wound sites   Selective Debridement - Tools Used Forceps;Scalpel;Scissors   Selective Debridement - Tissue Removed slough, biofilm   slough, asphalt   Wound Therapy - Clinical Statement wounds overall granulating with less drainage and decreased pain.  Improved tolerance for treatment less bandages used this session.    Wound Therapy - Functional Problem List difficulty walking, difficulty sleeping    Factors Delaying/Impairing Wound Healing Infection - systemic/local   Hydrotherapy Plan Debridement;Dressing change;Patient/family education   Wound Therapy - Frequency 3X / week  for 2 weeks then decrease to 2x a week for 2 weeks.   Wound Therapy - Current Recommendations PT   Wound Plan Pt to be seen for cleansing of his wounds as well as debridement and dressing changes.    Dressing   xeroform,vaseline,  4x4, kerlix, netting.                   PT Short Term Goals - 09/01/15 1452      PT SHORT TERM GOAL #1   Title Pt overall pain level to be no greater than 4/10 to allow  pt to don and doff shirts with ease.    Time 1   Period Weeks   Status On-going     PT SHORT TERM GOAL #2   Title Pt drainage of wounds to be scant to decrease risk of infection    Time 2   Period Weeks   Status On-going     PT SHORT TERM GOAL #3   Title Pt to be completing ROM for  bilateral UE and LE and trunk to prevent stiffness of joints and decrease edema.    Time 2   Period Weeks   Status On-going           PT Long Term Goals - 09/01/15 1452      PT LONG TERM GOAL #1   Title Pt pain to be no greater than a 2/10 to feel comfortable driving on his own   Time 4   Period  Weeks   Status On-going     PT LONG TERM GOAL #2   Title Pt wounds to have no drainage to decrease risk of infecton    Time 4   Period Weeks   Status On-going     PT LONG TERM GOAL #3   Title Pt to be able to walk for 15 mintues without stopping    Time 4   Period Weeks   Status On-going     PT LONG TERM GOAL #4   Title Pt wound sizes to be decreased by 50% to allow patient and family to feel comfortable with self care of his wounds.    Time 4   Period Weeks   Status On-going             Patient will benefit from skilled therapeutic intervention in order to improve the following deficits and impairments:     Visit Diagnosis: Bilateral low back pain without sciatica  Pain in right forearm  Pain in left forearm  Stiffness of left elbow, not elsewhere classified  Stiffness of right hand, not elsewhere classified  Laceration of right knee with foreign body, subsequent encounter  Laceration of left knee with foreign body, subsequent encounter  Laceration of right elbow with foreign body, subsequent encounter  Open wound of lumbar region without complication, subsequent encounter     Problem List There are no active problems to display for this patient.   Lurena Nidamy B Dalayna Lauter, PTA/CLT [phone]  09/04/2015, 5:55 PM  [facility] [address] [place], [place], [address] Phone: [phone]   Fax:  [phone]  Name: Gerda DissChristopher J Schwebach MRN: [id number] Date of Birth: 09/20/1985

## 2015-09-05 ENCOUNTER — Encounter: Payer: Self-pay | Admitting: Orthopaedic Surgery

## 2015-09-05 ENCOUNTER — Ambulatory Visit (INDEPENDENT_AMBULATORY_CARE_PROVIDER_SITE_OTHER): Payer: BLUE CROSS/BLUE SHIELD

## 2015-09-05 ENCOUNTER — Ambulatory Visit (INDEPENDENT_AMBULATORY_CARE_PROVIDER_SITE_OTHER): Payer: BLUE CROSS/BLUE SHIELD | Admitting: Orthopaedic Surgery

## 2015-09-05 VITALS — BP 150/91 | HR 84 | Temp 97.9°F | Ht 73.0 in | Wt 238.0 lb

## 2015-09-05 DIAGNOSIS — S92902D Unspecified fracture of left foot, subsequent encounter for fracture with routine healing: Secondary | ICD-10-CM

## 2015-09-05 DIAGNOSIS — S62101D Fracture of unspecified carpal bone, right wrist, subsequent encounter for fracture with routine healing: Secondary | ICD-10-CM
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
## 2015-09-06 ENCOUNTER — Ambulatory Visit (HOSPITAL_COMMUNITY): Payer: BLUE CROSS/BLUE SHIELD | Admitting: Physical Therapy

## 2015-09-06 DIAGNOSIS — M545 Low back pain, unspecified: Secondary | ICD-10-CM

## 2015-09-06 DIAGNOSIS — M25641 Stiffness of right hand, not elsewhere classified: Secondary | ICD-10-CM

## 2015-09-06 DIAGNOSIS — M79631 Pain in right forearm: Secondary | ICD-10-CM

## 2015-09-06 DIAGNOSIS — M79632 Pain in left forearm: Secondary | ICD-10-CM

## 2015-09-06 DIAGNOSIS — M25622 Stiffness of left elbow, not elsewhere classified: Secondary | ICD-10-CM

## 2015-09-06 DIAGNOSIS — S81021D Laceration with foreign body, right knee, subsequent encounter: Secondary | ICD-10-CM

## 2015-09-06 DIAGNOSIS — S51021D Laceration with foreign body of right elbow, subsequent encounter: Secondary | ICD-10-CM

## 2015-09-06 DIAGNOSIS — S31000D Unspecified open wound of lower back and pelvis without penetration into retroperitoneum, subsequent encounter: Secondary | ICD-10-CM

## 2015-09-06 DIAGNOSIS — S81022D Laceration with foreign body, left knee, subsequent encounter: Secondary | ICD-10-CM

## 2015-09-06 NOTE — Therapy (Signed)
[facility] [address] [place], [place], [address] Phone: [phone]   Fax:  [phone]  Wound Care Therapy  Patient Details  Name: Mitchell Hopkins MRN: [id number] Date of Birth: Feb 26, 1985 Referring Provider: Dr. Darreld Mclean   Encounter Date: 09/06/2015      PT End of Session - 09/06/15 1610    Visit Number 4   Number of Visits 10   Date for PT Re-Evaluation 09/30/15   Authorization Type BCBS   Authorization - Visit Number 4   Authorization - Number of Visits 10   PT Start Time 1300   PT Stop Time 1435   PT Time Calculation (min) 95 min   Activity Tolerance Patient tolerated treatment well   Behavior During Therapy [facility] for tasks assessed/performed      No past medical history on file.  No past surgical history on file.  There were no vitals filed for this visit.                  Wound Therapy - 09/06/15 1539    Subjective Pt states he would really like a shower.  Overall less pain and discomfort.  Ambulation is faster today.   Patient and Family Stated Goals Wounds to heal    Date of Onset 08/28/15   Prior Treatments ER, MD office    Pain Assessment No/denies pain   Pain Score 3    Wound Properties Date First Assessed: 08/30/15 Time First Assessed: 1511 Wound Type: Degloving Location: Back Location Orientation: Lower;Right Present on Admission: Yes   Dressing Type Gauze (Comment);Impregnated gauze (petrolatum)   Dressing Changed Changed   Dressing Status Old drainage   Dressing Change Frequency PRN   Site / Wound Assessment Friable;Painful;Red;Yellow   % Wound base Red or Granulating 90%   % Wound base Yellow 10%   Peri-wound Assessment Intact   Drainage Amount Minimal   Drainage Description Serosanguineous   Treatment Cleansed;Debridement (Selective)   Wound Properties Date First Assessed: 08/30/15 Time First Assessed: 1519 Wound Type: Degloving Location: Back Location Orientation: Left;Lower    Dressing Type Gauze (Comment);Impregnated gauze (petrolatum)   Dressing Changed Changed   Dressing Status Old drainage   Dressing Change Frequency PRN   Site / Wound Assessment Granulation tissue;Painful   % Wound base Red or Granulating 90%   % Wound base Yellow 10%   Peri-wound Assessment Intact   Margins Attached edges (approximated)   Drainage Amount Minimal   Drainage Description Serous   Treatment Cleansed;Debridement (Selective)   Wound Properties Date First Assessed: 08/30/15 Time First Assessed: 1534 Wound Type: Degloving Location: Arm Location Orientation: Left;Lateral Present on Admission: Yes   Dressing Type Gauze (Comment);Impregnated gauze (bismuth)   Dressing Changed Changed   Dressing Status None   Dressing Change Frequency PRN   Site / Wound Assessment Dry;Clean   % Wound base Red or Granulating 100%   % Wound base Yellow 0%   Drainage Amount None   Drainage Description --   Treatment Cleansed;Debridement (Selective)   Wound Properties Date First Assessed: 08/30/15 Wound Type: Degloving Location: Wrist Location Orientation: Left;Lateral   Dressing Type Gauze (Comment);Impregnated gauze (bismuth)   Dressing Changed Changed   Dressing Status Old drainage   Dressing Change Frequency PRN   % Wound base Red or Granulating 100%   % Wound base Yellow 0%   Margins Attached edges (approximated)   Drainage Amount --   Drainage Description --   Treatment Cleansed;Debridement (Selective)  Wound Properties Date First Assessed: 08/30/15 Time First Assessed: 1513 Wound Type: Degloving Location: Wrist Location Orientation: Left;Medial   Dressing Type Gauze (Comment);Impregnated gauze (bismuth)   Dressing Changed Changed   Dressing Status Old drainage   Dressing Change Frequency PRN   Site / Wound Assessment Granulation tissue;Painful;Red   % Wound base Red or Granulating 100%   Drainage Amount None   Drainage Description --   Treatment Cleansed;Debridement (Selective)    Wound Properties Date First Assessed: 08/30/15 Time First Assessed: 1555 Wound Type: Degloving Location: Hand Location Orientation: Left Wound Description (Comments): dorsal aspect  Present on Admission: Yes   Dressing Type Gauze (Comment);Impregnated gauze (bismuth)   Dressing Changed Changed   Dressing Status Old drainage   Dressing Change Frequency PRN   % Wound base Red or Granulating 100%   % Wound base Yellow 0%   Peri-wound Assessment Intact   Drainage Amount Minimal   Drainage Description Serous   Treatment Cleansed;Debridement (Selective)   Wound Properties Date First Assessed: 08/30/15 Time First Assessed: 1558 Wound Type: Degloving Location: Arm Location Orientation: Left Wound Description (Comments): Lt palmer forearm  Present on Admission: Yes Final Assessment Date: 09/06/15   Dressing Type --   Dressing Status --   Dressing Change Frequency --   % Wound base Red or Granulating --   % Wound base Yellow --   Peri-wound Assessment --   Drainage Amount --   Drainage Description --   Wound Properties Date First Assessed: 08/30/15 Time First Assessed: 1559 Wound Type: Degloving Location: Wrist Location Orientation: Left Wound Description (Comments): volar aspect of wrist and thenar eminence of hand    Dressing Type Gauze (Comment);Impregnated gauze (bismuth)   Dressing Changed Changed   Dressing Status Old drainage   Dressing Change Frequency PRN   Site / Wound Assessment Granulation tissue;Painful;Red;Yellow   % Wound base Red or Granulating 100%   % Wound base Yellow 0%   Peri-wound Assessment Intact   Drainage Amount Scant   Drainage Description Serous   Treatment Cleansed;Debridement (Selective)   Wound Properties Date First Assessed: 08/30/15 Time First Assessed: 1610 Wound Type: Degloving Location: Knee Location Orientation: Left;Anterior Present on Admission: Yes   Dressing Type Gauze (Comment);Impregnated gauze (bismuth)   Dressing Changed Changed   Dressing Status  Old drainage   Dressing Change Frequency PRN   Site / Wound Assessment Painful;Red;Yellow   % Wound base Red or Granulating 75%   % Wound base Yellow 25%   Drainage Amount Minimal   Drainage Description Serosanguineous   Treatment Cleansed;Debridement (Selective)   Wound Properties Date First Assessed: 08/30/15 Wound Type: Degloving Location: Elbow Location Orientation: Right Wound Description (Comments): has a hole in the center that his .5 cm deep   Dressing Type Gauze (Comment);Impregnated gauze (bismuth)   Dressing Changed Changed   Dressing Status Old drainage   Site / Wound Assessment Other (Comment)  grey   % Wound base Red or Granulating 50%   % Wound base Yellow 50%   % Wound base Other (Comment) --  grey   Drainage Amount Minimal   Drainage Description Serosanguineous   Treatment Cleansed;Debridement (Selective)   Wound Properties Date First Assessed: 08/30/15 Time First Assessed: 1616 Wound Type: Degloving Location: Arm Location Orientation: Right Wound Description (Comments): forearm inferior to elbow    Dressing Type --   Dressing Status --   Dressing Change Frequency --   Site / Wound Assessment --   % Wound base Red or Granulating --   %  Wound base Yellow --   Peri-wound Assessment --   Drainage Amount --   Drainage Description --   Wound Properties Date First Assessed: 08/30/15 Time First Assessed: 1610 Wound Type: Degloving Location: Arm Location Orientation: Right;Medial Wound Description (Comments): forearm Present on Admission: Yes   Dressing Type Gauze (Comment);Impregnated gauze (bismuth)   Dressing Status Old drainage   Dressing Change Frequency PRN   Site / Wound Assessment Granulation tissue;Painful   % Wound base Red or Granulating 95%   % Wound base Yellow 5%   Peri-wound Assessment Edema   Drainage Amount Scant   Drainage Description Serous   Wound Properties Date First Assessed: 08/30/15 Time First Assessed: 1621 Wound Type: Degloving Location:  Hand Location Orientation: Right Wound Description (Comments): dorsal aspect    % Wound base Red or Granulating 100%   Drainage Amount Scant   Wound Properties Date First Assessed: 08/30/15 Time First Assessed: 1642 Wound Type: Degloving Location: Knee Location Orientation: Right   Dressing Type Gauze (Comment);Impregnated gauze (bismuth)   Dressing Status Old drainage   Dressing Change Frequency PRN   Site / Wound Assessment Painful;Red;Yellow   % Wound base Red or Granulating 60%   % Wound base Yellow 40%   Drainage Amount Moderate   Drainage Description Purulent   Selective Debridement - Location --  wound sites   Selective Debridement - Tools Used Forceps;Scalpel;Scissors   Selective Debridement - Tissue Removed slough, biofilm   slough, asphalt   Wound Therapy - Clinical Statement Rt and Lt forearm wounds are now healed.  ONly with wound on Rt elbow, Lt hand (dorsal and volar sides), bilateral knees and distal LB.  Pain decreased and granulation tissue increased.  Pt to take a shower prior to next session.    Wound Therapy - Functional Problem List difficulty walking, difficulty sleeping    Factors Delaying/Impairing Wound Healing Infection - systemic/local   Hydrotherapy Plan Debridement;Dressing change;Patient/family education   Wound Therapy - Frequency 3X / week  for 2 weeks then decrease to 2x a week for 2 weeks.   Wound Therapy - Current Recommendations PT   Wound Plan Pt to be seen for cleansing of his wounds as well as debridement and dressing changes.    Dressing   xeroform,vaseline,  4x4, kerlix, netting.                   PT Short Term Goals - 09/01/15 1452      PT SHORT TERM GOAL #1   Title Pt overall pain level to be no greater than 4/10 to allow pt to don and doff shirts with ease.    Time 1   Period Weeks   Status On-going     PT SHORT TERM GOAL #2   Title Pt drainage of wounds to be scant to decrease risk of infection    Time 2   Period Weeks    Status On-going     PT SHORT TERM GOAL #3   Title Pt to be completing ROM for  bilateral UE and LE and trunk to prevent stiffness of joints and decrease edema.    Time 2   Period Weeks   Status On-going           PT Long Term Goals - 09/01/15 1452      PT LONG TERM GOAL #1   Title Pt pain to be no greater than a 2/10 to feel comfortable driving on his own   Time 4   Period  Weeks   Status On-going     PT LONG TERM GOAL #2   Title Pt wounds to have no drainage to decrease risk of infecton    Time 4   Period Weeks   Status On-going     PT LONG TERM GOAL #3   Title Pt to be able to walk for 15 mintues without stopping    Time 4   Period Weeks   Status On-going     PT LONG TERM GOAL #4   Title Pt wound sizes to be decreased by 50% to allow patient and family to feel comfortable with self care of his wounds.    Time 4   Period Weeks   Status On-going             Patient will benefit from skilled therapeutic intervention in order to improve the following deficits and impairments:     Visit Diagnosis: Bilateral low back pain without sciatica  Pain in right forearm  Pain in left forearm  Stiffness of left elbow, not elsewhere classified  Stiffness of right hand, not elsewhere classified  Laceration of right knee with foreign body, subsequent encounter  Laceration of left knee with foreign body, subsequent encounter  Laceration of right elbow with foreign body, subsequent encounter  Open wound of lumbar region without complication, subsequent encounter     Problem List There are no active problems to display for this patient.   Lurena Nida, PTA/CLT [phone]  09/06/2015, 4:25 PM  [facility] [address] [place], [place], [address] Phone: [phone]   Fax:  [phone]  Name: Mitchell Hopkins MRN: [id number] Date of Birth: 08/12/1985

## 2015-09-08 ENCOUNTER — Ambulatory Visit (HOSPITAL_COMMUNITY): Payer: BLUE CROSS/BLUE SHIELD | Attending: Orthopaedic Surgery | Admitting: Physical Therapy

## 2015-09-08 DIAGNOSIS — M79631 Pain in right forearm: Secondary | ICD-10-CM | POA: Diagnosis present

## 2015-09-08 DIAGNOSIS — M545 Low back pain, unspecified: Secondary | ICD-10-CM

## 2015-09-08 DIAGNOSIS — S51021D Laceration with foreign body of right elbow, subsequent encounter: Secondary | ICD-10-CM | POA: Diagnosis present

## 2015-09-08 DIAGNOSIS — S81022D Laceration with foreign body, left knee, subsequent encounter: Secondary | ICD-10-CM | POA: Insufficient documentation

## 2015-09-08 DIAGNOSIS — S31000D Unspecified open wound of lower back and pelvis without penetration into retroperitoneum, subsequent encounter: Secondary | ICD-10-CM | POA: Diagnosis present

## 2015-09-08 DIAGNOSIS — M25641 Stiffness of right hand, not elsewhere classified: Secondary | ICD-10-CM | POA: Insufficient documentation

## 2015-09-08 DIAGNOSIS — M79632 Pain in left forearm: Secondary | ICD-10-CM | POA: Diagnosis present

## 2015-09-08 DIAGNOSIS — S81021D Laceration with foreign body, right knee, subsequent encounter: Secondary | ICD-10-CM | POA: Diagnosis present

## 2015-09-08 DIAGNOSIS — M25622 Stiffness of left elbow, not elsewhere classified: Secondary | ICD-10-CM | POA: Insufficient documentation

## 2015-09-08 NOTE — Therapy (Signed)
[place] [facility] [address] [place], [place], [address] Phone: [phone]   Fax:  [phone]  Wound Care Therapy  Patient Details  Name: Mitchell Hopkins MRN: [id number] Date of Birth: December 19, 1985 Referring Provider: Dr. Darreld Mclean   Encounter Date: 09/08/2015      PT End of Session - 09/08/15 1243    Visit Number 5   Number of Visits 10   Date for PT Re-Evaluation 09/30/15   Authorization Type BCBS   Authorization - Visit Number 5   Authorization - Number of Visits 10   PT Start Time 0900   PT Stop Time 1030   PT Time Calculation (min) 90 min   Activity Tolerance Patient tolerated treatment well   Behavior During Therapy [facility] for tasks assessed/performed      No past medical history on file.  No past surgical history on file.  There were no vitals filed for this visit.                  Wound Therapy - 09/08/15 1232    Subjective Pt states he is feeling better.   Patient and Family Stated Goals Wounds to heal    Date of Onset 08/28/15   Prior Treatments ER, MD office    Pain Assessment 0-10   Pain Score 2    Multiple Pain Sites Yes  all wound sites    Wound Properties Date First Assessed: 08/30/15 Time First Assessed: 1511 Wound Type: Degloving Location: Back Location Orientation: Lower;Right Present on Admission: Yes   Dressing Type Gauze (Comment);Impregnated gauze (petrolatum)   Dressing Changed Changed   Dressing Status Old drainage   Dressing Change Frequency PRN   Site / Wound Assessment Friable;Painful;Red;Yellow   % Wound base Red or Granulating 95%   % Wound base Yellow 5%   Peri-wound Assessment Intact   Drainage Amount Scant   Drainage Description Serous   Treatment Cleansed;Debridement (Selective)   Wound Properties Date First Assessed: 08/30/15 Time First Assessed: 1519 Wound Type: Degloving Location: Back Location Orientation: Left;Lower   Dressing Type Gauze (Comment);Impregnated gauze  (petrolatum)   Dressing Changed Changed   Dressing Status Old drainage   Dressing Change Frequency PRN   Site / Wound Assessment Granulation tissue;Painful   % Wound base Red or Granulating 90%   % Wound base Yellow 10%   Peri-wound Assessment Intact   Margins Attached edges (approximated)   Drainage Amount Minimal   Drainage Description Sanguineous   Treatment Cleansed;Debridement (Selective)   Wound Properties Date First Assessed: 08/30/15 Time First Assessed: 1534 Wound Type: Degloving Location: Arm Location Orientation: Left;Lateral Present on Admission: Yes Final Assessment Date: 09/08/15 Final Assessment Time: 0935   Dressing Type (P)  Gauze (Comment);Impregnated gauze (bismuth)   Dressing Status (P)  --   Dressing Change Frequency (P)  --   Site / Wound Assessment (P)  --   % Wound base Red or Granulating (P)  --   % Wound base Yellow (P)  --   Drainage Amount (P)  --   Wound Properties Date First Assessed: 08/30/15 Wound Type: Degloving Location: Wrist Location Orientation: Left;Lateral Final Assessment Date: 09/08/15 Final Assessment Time: 0936   Dressing Type (P)  --   Dressing Status (P)  --   Dressing Change Frequency (P)  --   % Wound base Red or Granulating (P)  --   % Wound base Yellow (P)  --   Margins (P)  --   Wound Properties  Date First Assessed: 08/30/15 Time First Assessed: 1513 Wound Type: Degloving Location: Wrist Location Orientation: Left;Medial Final Assessment Date: 09/08/15 Final Assessment Time: 0937   Dressing Type (P)  --   Dressing Status (P)  --   Dressing Change Frequency (P)  --   Site / Wound Assessment (P)  --   % Wound base Red or Granulating (P)  --   Drainage Amount (P)  --   Wound Properties Date First Assessed: 08/30/15 Time First Assessed: 1555 Wound Type: Degloving Location: Hand Location Orientation: Left Wound Description (Comments): dorsal aspect  Present on Admission: Yes   Dressing Type Gauze (Comment);Impregnated gauze (bismuth)    Dressing Changed Changed   Dressing Status Old drainage   Dressing Change Frequency PRN   % Wound base Red or Granulating 100%   % Wound base Yellow 0%   Peri-wound Assessment Intact   Drainage Amount Minimal   Drainage Description Serous   Treatment Cleansed;Debridement (Selective)   Wound Properties Date First Assessed: 08/30/15 Time First Assessed: 1559 Wound Type: Degloving Location: Wrist Location Orientation: Left Wound Description (Comments): volar aspect of wrist and thenar eminence of hand    Dressing Type Gauze (Comment);Impregnated gauze (bismuth)   Dressing Changed Changed   Dressing Status Old drainage   Dressing Change Frequency PRN   Site / Wound Assessment Granulation tissue;Painful;Red;Yellow   % Wound base Red or Granulating 100%   % Wound base Yellow 0%   Peri-wound Assessment Intact   Drainage Amount Scant   Drainage Description Serous   Treatment Cleansed;Debridement (Selective)   Wound Properties Date First Assessed: 08/30/15 Time First Assessed: 1610 Wound Type: Degloving Location: Knee Location Orientation: Left;Anterior Present on Admission: Yes   Dressing Type Gauze (Comment);Impregnated gauze (bismuth)   Dressing Changed Changed   Dressing Status Old drainage   Dressing Change Frequency PRN   Site / Wound Assessment Painful;Red;Yellow   % Wound base Red or Granulating 75%   % Wound base Yellow 25%   Drainage Amount Minimal   Drainage Description Serosanguineous   Treatment Cleansed;Debridement (Selective)   Wound Properties Date First Assessed: 08/30/15 Wound Type: Degloving Location: Elbow Location Orientation: Right Wound Description (Comments): has a hole in the center that his .5 cm deep   Dressing Type Gauze (Comment);Impregnated gauze (bismuth)   Dressing Changed Changed   Dressing Status Old drainage   Site / Wound Assessment Other (Comment)  grey   % Wound base Red or Granulating 75%   % Wound base Yellow 25%   % Wound base Other (Comment) --   grey   Drainage Amount Minimal   Drainage Description Serosanguineous   Treatment Cleansed;Debridement (Selective)   Wound Properties Date First Assessed: 08/30/15 Time First Assessed: 1610 Wound Type: Degloving Location: Arm Location Orientation: Right;Medial Wound Description (Comments): forearm Present on Admission: Yes   Dressing Type Gauze (Comment);Impregnated gauze (bismuth)   Dressing Status Old drainage   Dressing Change Frequency PRN   Site / Wound Assessment Granulation tissue;Painful   % Wound base Red or Granulating 95%   % Wound base Yellow 5%   Peri-wound Assessment Edema   Drainage Amount Scant   Drainage Description Serous   Treatment Cleansed;Debridement (Selective)   Wound Properties Date First Assessed: 08/30/15 Time First Assessed: 1621 Wound Type: Degloving Location: Hand Location Orientation: Right Wound Description (Comments): dorsal aspect    % Wound base Red or Granulating 100%   Drainage Amount Scant   Wound Properties Date First Assessed: 08/30/15 Time First Assessed: 1642 Wound  Type: Degloving Location: Knee Location Orientation: Right   Dressing Type --   Dressing Status --   Dressing Change Frequency --   Site / Wound Assessment --   % Wound base Red or Granulating --   % Wound base Yellow --   Drainage Amount Scant   Drainage Description Serous;Purulent   Treatment Cleansed   Wound Properties Date First Assessed: 08/30/15 Time First Assessed: 1616 Wound Type: Degloving Location: Arm Location Orientation: Right Wound Description (Comments): forearm inferior to elbow  Final Assessment Date: 09/08/15 Final Assessment Time: 0940   Selective Debridement - Location wound sites   wound sites   Selective Debridement - Tools Used Forceps;Scalpel;Scissors   Selective Debridement - Tissue Removed slough, biofilm   slough, asphalt   Wound Therapy - Clinical Statement All wounds continue to improve in granulation and drainage.  Pt will most likely be able to be  discharged from skilled care at the end of next week.     Wound Therapy - Functional Problem List difficulty walking, difficulty sleeping    Factors Delaying/Impairing Wound Healing Infection - systemic/local   Hydrotherapy Plan Debridement;Dressing change;Patient/family education   Wound Therapy - Frequency 3X / week  for 2 weeks then decrease to 2x a week for 2 weeks.   Wound Therapy - Current Recommendations PT   Wound Plan Pt to be seen for cleansing of his wounds as well as debridement and dressing changes.    Dressing   xeroform,vaseline,  4x4, kerlix, netting.                   PT Short Term Goals - 09/01/15 1452      PT SHORT TERM GOAL #1   Title Pt overall pain level to be no greater than 4/10 to allow pt to don and doff shirts with ease.    Time 1   Period Weeks   Status On-going     PT SHORT TERM GOAL #2   Title Pt drainage of wounds to be scant to decrease risk of infection    Time 2   Period Weeks   Status On-going     PT SHORT TERM GOAL #3   Title Pt to be completing ROM for  bilateral UE and LE and trunk to prevent stiffness of joints and decrease edema.    Time 2   Period Weeks   Status On-going           PT Long Term Goals - 09/01/15 1452      PT LONG TERM GOAL #1   Title Pt pain to be no greater than a 2/10 to feel comfortable driving on his own   Time 4   Period Weeks   Status On-going     PT LONG TERM GOAL #2   Title Pt wounds to have no drainage to decrease risk of infecton    Time 4   Period Weeks   Status On-going     PT LONG TERM GOAL #3   Title Pt to be able to walk for 15 mintues without stopping    Time 4   Period Weeks   Status On-going     PT LONG TERM GOAL #4   Title Pt wound sizes to be decreased by 50% to allow patient and family to feel comfortable with self care of his wounds.    Time 4   Period Weeks   Status On-going  Plan - 09/08/15 1243    Clinical Impression Statement as above     Rehab Potential Good   PT Frequency 3x / week   PT Duration 2 weeks  followed by 2x a week for 2 weeks    PT Treatment/Interventions Patient/family education;Therapeutic exercise;Other (comment);ADLs/Self Care Home Management   PT Next Visit Plan See pt for wound care       Patient will benefit from skilled therapeutic intervention in order to improve the following deficits and impairments:  Decreased activity tolerance, Pain, Other (comment), Difficulty walking (multiple open wounds )  Visit Diagnosis: Bilateral low back pain without sciatica  Pain in right forearm  Stiffness of right hand, not elsewhere classified  Laceration of right knee with foreign body, subsequent encounter  Laceration of left knee with foreign body, subsequent encounter  Laceration of right elbow with foreign body, subsequent encounter  Open wound of lumbar region without complication, subsequent encounter  Pain in left forearm  Stiffness of left elbow, not elsewhere classified     Problem List There are no active problems to display for this patient.  Virgina Organ, PT CLT [phone] 09/08/2015, 12:44 PM  [place] [facility] [address] [place], [place], [address] Phone: [phone]   Fax:  [phone]  Name: Mitchell Hopkins MRN: [id number] Date of Birth: 10/02/1985

## 2015-09-12 ENCOUNTER — Ambulatory Visit (HOSPITAL_COMMUNITY): Payer: BLUE CROSS/BLUE SHIELD

## 2015-09-12 DIAGNOSIS — M79632 Pain in left forearm: Secondary | ICD-10-CM

## 2015-09-12 DIAGNOSIS — M25641 Stiffness of right hand, not elsewhere classified: Secondary | ICD-10-CM

## 2015-09-12 DIAGNOSIS — S81021D Laceration with foreign body, right knee, subsequent encounter: Secondary | ICD-10-CM

## 2015-09-12 DIAGNOSIS — S31000D Unspecified open wound of lower back and pelvis without penetration into retroperitoneum, subsequent encounter: Secondary | ICD-10-CM

## 2015-09-12 DIAGNOSIS — M79631 Pain in right forearm: Secondary | ICD-10-CM

## 2015-09-12 DIAGNOSIS — S51021D Laceration with foreign body of right elbow, subsequent encounter: Secondary | ICD-10-CM

## 2015-09-12 DIAGNOSIS — S81022D Laceration with foreign body, left knee, subsequent encounter: Secondary | ICD-10-CM

## 2015-09-12 DIAGNOSIS — M25622 Stiffness of left elbow, not elsewhere classified: Secondary | ICD-10-CM

## 2015-09-12 DIAGNOSIS — M545 Low back pain, unspecified: Secondary | ICD-10-CM

## 2015-09-12 NOTE — Therapy (Signed)
[place] [facility] [address] [place], [place], [address] Phone: [phone]   Fax:  [phone]  Wound Care Therapy  Patient Details  Name: Mitchell Hopkins MRN: [id number] Date of Birth: 1985/11/07 Referring Provider: Dr. Darreld Mclean   Encounter Date: 09/12/2015      PT End of Session - 09/12/15 1428    Visit Number 6   Number of Visits 10   Date for PT Re-Evaluation 09/30/15   Authorization Type BCBS   Authorization - Visit Number 6   Authorization - Number of Visits 10   PT Start Time 1300   PT Stop Time 1402   PT Time Calculation (min) 62 min   Activity Tolerance Patient tolerated treatment well   Behavior During Therapy [facility] for tasks assessed/performed      No past medical history on file.  No past surgical history on file.  There were no vitals filed for this visit.       Subjective Assessment - 09/12/15 1414    Subjective Pt stated no reports of pain,  Dressings are intact today upon entrance                   Wound Therapy - 09/12/15 1415    Subjective Pt stated no reports of pain,  Dressings are intact today upon entrance   Patient and Family Stated Goals Wounds to heal    Date of Onset 08/28/15   Prior Treatments ER, MD office    Pain Assessment No/denies pain   Wound Properties Date First Assessed: 08/30/15 Time First Assessed: 1511 Wound Type: Degloving Location: Back Location Orientation: Lower;Right Present on Admission: Yes   Dressing Type Impregnated gauze (bismuth);Gauze (Comment);Other (Comment)  xeroform, 2x2 and medipore tape   Dressing Changed Changed   Dressing Status Old drainage   Dressing Change Frequency PRN   Site / Wound Assessment Pale;Pink;Clean;Dry;Granulation tissue   % Wound base Red or Granulating --  98%   % Wound base Yellow --  2&   Peri-wound Assessment Intact   Drainage Amount Scant   Drainage Description Serous   Treatment Cleansed;Debridement (Selective)    Wound Properties Date First Assessed: 08/30/15 Time First Assessed: 1519 Wound Type: Degloving Location: Back Location Orientation: Left;Lower   Dressing Type None   % Wound base Red or Granulating 100%   % Wound base Yellow 0%   Peri-wound Assessment Intact   Margins Attached edges (approximated)   Drainage Amount None   Wound Properties Date First Assessed: 08/30/15 Time First Assessed: 1555 Wound Type: Degloving Location: Hand Location Orientation: Left Wound Description (Comments): dorsal aspect  Present on Admission: Yes   Dressing Type Gauze (Comment);Impregnated gauze (bismuth)   Dressing Changed Changed   Dressing Status Old drainage   Dressing Change Frequency PRN   % Wound base Red or Granulating 100%   % Wound base Yellow 0%   Peri-wound Assessment Intact   Drainage Amount Minimal   Drainage Description Serous   Treatment Cleansed;Debridement (Selective)   Wound Properties Date First Assessed: 08/30/15 Time First Assessed: 1559 Wound Type: Degloving Location: Wrist Location Orientation: Left Wound Description (Comments): volar aspect of wrist and thenar eminence of hand    Dressing Type Gauze (Comment);Impregnated gauze (bismuth)   Dressing Changed Changed   Dressing Status Old drainage   Dressing Change Frequency PRN   Site / Wound Assessment Granulation tissue   Peri-wound Assessment Intact   Drainage Amount Scant   Drainage Description Serous   Treatment  Cleansed;Debridement (Selective)   Wound Properties Date First Assessed: 08/30/15 Time First Assessed: 1610 Wound Type: Degloving Location: Knee Location Orientation: Left;Anterior Present on Admission: Yes   Dressing Type Gauze (Comment);Impregnated gauze (bismuth)   Dressing Changed Changed   Dressing Status Old drainage   Dressing Change Frequency PRN   Site / Wound Assessment Red;Yellow   % Wound base Red or Granulating 80%   % Wound base Yellow 20%   Drainage Amount Minimal   Drainage Description  Serosanguineous   Treatment Cleansed;Debridement (Selective)   Wound Properties Date First Assessed: 08/30/15 Wound Type: Degloving Location: Elbow Location Orientation: Right Wound Description (Comments): has a hole in the center that his .5 cm deep   Dressing Type Gauze (Comment);Impregnated gauze (bismuth)   Dressing Changed Changed   Dressing Status Old drainage   Site / Wound Assessment Red   % Wound base Red or Granulating 90%   % Wound base Yellow 0%   % Wound base Other (Comment) --  10% grey   Drainage Amount Minimal   Drainage Description Serosanguineous   Treatment Cleansed;Debridement (Selective)   Wound Properties Date First Assessed: 08/30/15 Time First Assessed: 1610 Wound Type: Degloving Location: Arm Location Orientation: Right;Medial Wound Description (Comments): forearm Present on Admission: Yes   Dressing Type Gauze (Comment);Impregnated gauze (bismuth)   Dressing Changed Changed   Dressing Status Old drainage   Dressing Change Frequency PRN   Site / Wound Assessment Granulation tissue   % Wound base Red or Granulating 100%   % Wound base Yellow 0%   Peri-wound Assessment Edema   Drainage Amount None   Treatment Cleansed  cleansed forearm, no debridement necessary, dressings change   Wound Properties Date First Assessed: 08/30/15 Time First Assessed: 1621 Wound Type: Degloving Location: Hand Location Orientation: Right Wound Description (Comments): dorsal aspect  Final Assessment Date: 09/12/15 Final Assessment Time: 1340   Wound Properties Date First Assessed: 08/30/15 Time First Assessed: 1642 Wound Type: Degloving Location: Knee Location Orientation: Right   Selective Debridement - Location wound sites    Selective Debridement - Tools Used Forceps;Scalpel   Selective Debridement - Tissue Removed slough, biofilm and dryskin   Wound Therapy - Clinical Statement All wounds are making great progress with increased granulation and decreased drainage as well as  decreased pain through session.  Discussion held and education with pt and mother for care at home due to anticipation DC next session with wounds progressing so well.   Wound Therapy - Functional Problem List difficulty walking, difficulty sleeping    Factors Delaying/Impairing Wound Healing Infection - systemic/local   Hydrotherapy Plan Debridement;Dressing change;Patient/family education   Wound Therapy - Frequency 3X / week  for 2 weeks then decreased to 2x/week for 2 weeks   Wound Therapy - Current Recommendations PT   Wound Plan Pt to be seen for cleansing of his wounds as well as debridement and dressing changes.   Anticipate DC next session   Dressing   xeroform,vaseline,  4x4, kerlix, netting.                   PT Short Term Goals - 09/01/15 1452      PT SHORT TERM GOAL #1   Title Pt overall pain level to be no greater than 4/10 to allow pt to don and doff shirts with ease.    Time 1   Period Weeks   Status On-going     PT SHORT TERM GOAL #2   Title Pt drainage of wounds  to be scant to decrease risk of infection    Time 2   Period Weeks   Status On-going     PT SHORT TERM GOAL #3   Title Pt to be completing ROM for  bilateral UE and LE and trunk to prevent stiffness of joints and decrease edema.    Time 2   Period Weeks   Status On-going           PT Long Term Goals - 09/01/15 1452      PT LONG TERM GOAL #1   Title Pt pain to be no greater than a 2/10 to feel comfortable driving on his own   Time 4   Period Weeks   Status On-going     PT LONG TERM GOAL #2   Title Pt wounds to have no drainage to decrease risk of infecton    Time 4   Period Weeks   Status On-going     PT LONG TERM GOAL #3   Title Pt to be able to walk for 15 mintues without stopping    Time 4   Period Weeks   Status On-going     PT LONG TERM GOAL #4   Title Pt wound sizes to be decreased by 50% to allow patient and family to feel comfortable with self care of his wounds.     Time 4   Period Weeks   Status On-going             Patient will benefit from skilled therapeutic intervention in order to improve the following deficits and impairments:     Visit Diagnosis: Bilateral low back pain without sciatica  Pain in right forearm  Stiffness of right hand, not elsewhere classified  Laceration of right knee with foreign body, subsequent encounter  Laceration of left knee with foreign body, subsequent encounter  Laceration of right elbow with foreign body, subsequent encounter  Open wound of lumbar region without complication, subsequent encounter  Pain in left forearm  Stiffness of left elbow, not elsewhere classified     Problem List There are no active problems to display for this patient.  188 Maple LaneCasey Jazarah Capili, LPTA; CBIS [phone]  Juel BurrowCockerham, Malala Trenkamp Jo 09/12/2015, 2:36 PM  [facility] [address] [place], [place], [address] Phone: [phone]   Fax:  [phone]  Name: Mitchell Hopkins MRN: [id number] Date of Birth: 01/20/1985

## 2015-09-14 ENCOUNTER — Ambulatory Visit (HOSPITAL_COMMUNITY): Payer: BLUE CROSS/BLUE SHIELD

## 2015-09-14 DIAGNOSIS — M79631 Pain in right forearm: Secondary | ICD-10-CM

## 2015-09-14 DIAGNOSIS — M545 Low back pain, unspecified: Secondary | ICD-10-CM

## 2015-09-14 DIAGNOSIS — M25622 Stiffness of left elbow, not elsewhere classified: Secondary | ICD-10-CM

## 2015-09-14 DIAGNOSIS — S81021D Laceration with foreign body, right knee, subsequent encounter: Secondary | ICD-10-CM

## 2015-09-14 DIAGNOSIS — M25641 Stiffness of right hand, not elsewhere classified: Secondary | ICD-10-CM

## 2015-09-14 DIAGNOSIS — S31000D Unspecified open wound of lower back and pelvis without penetration into retroperitoneum, subsequent encounter: Secondary | ICD-10-CM

## 2015-09-14 DIAGNOSIS — S51021D Laceration with foreign body of right elbow, subsequent encounter: Secondary | ICD-10-CM

## 2015-09-14 DIAGNOSIS — M79632 Pain in left forearm: Secondary | ICD-10-CM

## 2015-09-14 DIAGNOSIS — S81022D Laceration with foreign body, left knee, subsequent encounter: Secondary | ICD-10-CM

## 2015-09-14 NOTE — Therapy (Signed)
[place], [place], [address] Phone: [phone]   Fax:  [phone]  Physical Therapy Treatment  Patient Details  Name: Mitchell Hopkins MRN: [id number] Date of Birth: [birth year] Referring Provider: Dr. Sanjuana Kava   Encounter Date: 09/14/2015      PT End of Session - 09/14/15 1614    Visit Number 7   Number of Visits 10   Date for PT Re-Evaluation 09/30/15   Authorization Type [employer]   Authorization - Visit Number 7   Authorization - Number of Visits 10   PT Start Time 8099   PT Stop Time 1615   PT Time Calculation (min) 40 min   Activity Tolerance Patient tolerated treatment well   Behavior During Therapy [facility] for tasks assessed/performed      No past medical history on file.  No past surgical history on file.  There were no vitals filed for this visit.      Subjective Assessment - 09/14/15 1615    Subjective Pt took shower prior session today, stated it felt soo good.  No reports of pain today.   Currently in Pain? No/denies                       Wound Therapy - 09/14/15 1616    Subjective Pt took shower prior session today, stated it felt soo good.  No reports of pain today.   Patient and Family Stated Goals Wounds to heal    Date of Onset 08/28/15   Prior Treatments ER, MD office    Pain Assessment No/denies pain   Pain Score 0-No pain   Wound Properties Date First Assessed: 08/30/15 Time First Assessed: 1511 Wound Type: Degloving Location: Back Location Orientation: Lower;Right Present on Admission: Yes   Dressing Type None   Site / Wound Assessment Granulation tissue   % Wound base Red or Granulating 100%   Peri-wound Assessment Intact   Wound Length (cm) 0 cm   Wound Width (cm) 0 cm   Wound Depth (cm) 0 cm   Drainage Amount None   Treatment Cleansed   Wound Properties Date First Assessed: 08/30/15 Time First Assessed: 1519 Wound Type: Degloving Location: Back Location  Orientation: Left;Lower   Dressing Type None   Site / Wound Assessment Granulation tissue   % Wound base Red or Granulating 100%   % Wound base Yellow 0%   Peri-wound Assessment Intact   Wound Length (cm) 0 cm   Wound Width (cm) 0 cm   Wound Depth (cm) 0 cm   Drainage Amount None   Treatment Cleansed   Wound Properties Date First Assessed: 08/30/15 Time First Assessed: 8338 Wound Type: Degloving Location: Hand Location Orientation: Left Wound Description (Comments): dorsal aspect  Present on Admission: Yes   Dressing Type Gauze (Comment);Impregnated gauze (bismuth)  medipore tape   Dressing Changed Changed   Dressing Status Old drainage   Dressing Change Frequency PRN   Site / Wound Assessment Granulation tissue   % Wound base Red or Granulating 100%   % Wound base Yellow 0%   Peri-wound Assessment Intact   Wound Length (cm) 0.4 cm   Wound Width (cm) 0.3 cm   Wound Depth (cm) 0 cm   Drainage Amount None   Treatment Cleansed  no debridement necessary   Wound Properties Date First Assessed: 08/30/15 Time First Assessed: 2505 Wound Type: Degloving Location: Wrist Location Orientation: Left Wound Description (Comments): volar aspect of wrist  and thenar eminence of hand    Dressing Type None   Site / Wound Assessment Granulation tissue   % Wound base Red or Granulating 100%   % Wound base Yellow 0%   Peri-wound Assessment Intact   Wound Length (cm) 0 cm   Wound Width (cm) 0 cm   Wound Depth (cm) 0 cm   Drainage Amount None   Treatment Cleansed   Wound Properties Date First Assessed: 08/30/15 Time First Assessed: 1610 Wound Type: Degloving Location: Knee Location Orientation: Left;Anterior Present on Admission: Yes   Dressing Type Gauze (Comment);Impregnated gauze (bismuth)   Dressing Changed Changed   Dressing Status Old drainage   Dressing Change Frequency PRN   Site / Wound Assessment Red;Yellow   % Wound base Red or Granulating 95%   % Wound base Yellow 5%   % Wound base  Black 0%   Peri-wound Assessment Intact   Wound Length (cm) 1.1 cm   Wound Width (cm) 2.5 cm   Wound Depth (cm) 0 cm   Drainage Amount Scant   Drainage Description Serosanguineous   Treatment Cleansed;Debridement (Selective)   Wound Properties Date First Assessed: 08/30/15 Wound Type: Degloving Location: Elbow Location Orientation: Right Wound Description (Comments): has a hole in the center that his .5 cm deep   Dressing Type Gauze (Comment);Impregnated gauze (bismuth)   Dressing Changed Changed   Dressing Status Old drainage   Site / Wound Assessment Other (Comment);Red  grey   % Wound base Red or Granulating 90%   % Wound base Yellow 0%   % Wound base Black 0%   % Wound base Other (Comment) --  10% grey   Peri-wound Assessment Intact   Wound Length (cm) 1.2 cm   Wound Width (cm) 1.8 cm   Wound Depth (cm) 0 cm   Drainage Amount Scant   Drainage Description Serosanguineous   Treatment Cleansed;Debridement (Selective)  Debridement for removal of dead skin   Wound Properties Date First Assessed: 08/30/15 Time First Assessed: 1610 Wound Type: Degloving Location: Arm Location Orientation: Right;Medial Wound Description (Comments): forearm Present on Admission: Yes   Dressing Type None   Site / Wound Assessment Granulation tissue   % Wound base Red or Granulating 100%   % Wound base Yellow 0%   % Wound base Black 0%   Peri-wound Assessment Edema;Intact   Wound Length (cm) 0 cm   Wound Width (cm) 0 cm   Wound Depth (cm) 0 cm   Drainage Amount None   Treatment Cleansed   Wound Properties Date First Assessed: 08/30/15 Time First Assessed: 1642 Wound Type: Degloving Location: Knee Location Orientation: Right   Dressing Type None   Site / Wound Assessment Granulation tissue   % Wound base Red or Granulating 100%   % Wound base Yellow 0%   % Wound base Black 0%   % Wound base Other (Comment) 0%   Peri-wound Assessment Intact   Wound Length (cm) 0 cm   Wound Width (cm) 0 cm    Wound Depth (cm) 0 cm   Drainage Amount None   Treatment Cleansed   Selective Debridement - Location wound sites    Selective Debridement - Tools Used Forceps   Selective Debridement - Tissue Removed biofilm and dryskin   Wound Therapy - Clinical Statement All wounds progressing well with no further skilled interventin required.  Reviewed proper care with pt and mother with abiltiy to verbalize proper care techniques with no questions concerning.     Wound Therapy -  Functional Problem List difficulty walking, difficulty sleeping    Factors Delaying/Impairing Wound Healing Infection - systemic/local   Hydrotherapy Plan Debridement;Dressing change;Patient/family education   Wound Therapy - Frequency 3X / week   Wound Therapy - Current Recommendations PT   Wound Plan DC to home care   Dressing   xeroform,vaseline,  4x4, kerlix, netting.                  PT Education - 09/14/15 1644    Education provided Yes   Education Details Educated proper wound care for discharge,  Pt able to verbalize appropraite technqiue.   Person(s) Educated Patient;Parent(s)   Methods Explanation;Demonstration   Comprehension Verbalized understanding;Returned demonstration          PT Short Term Goals - 09/01/15 1452      PT SHORT TERM GOAL #1   Title Pt overall pain level to be no greater than 4/10 to allow pt to don and doff shirts with ease.    Time 1   Period Weeks   Status On-going     PT SHORT TERM GOAL #2   Title Pt drainage of wounds to be scant to decrease risk of infection    Time 2   Period Weeks   Status On-going     PT SHORT TERM GOAL #3   Title Pt to be completing ROM for  bilateral UE and LE and trunk to prevent stiffness of joints and decrease edema.    Time 2   Period Weeks   Status On-going           PT Long Term Goals - 09/01/15 1452      PT LONG TERM GOAL #1   Title Pt pain to be no greater than a 2/10 to feel comfortable driving on his own   Time 4    Period Weeks   Status On-going     PT LONG TERM GOAL #2   Title Pt wounds to have no drainage to decrease risk of infecton    Time 4   Period Weeks   Status On-going     PT LONG TERM GOAL #3   Title Pt to be able to walk for 15 mintues without stopping    Time 4   Period Weeks   Status On-going     PT LONG TERM GOAL #4   Title Pt wound sizes to be decreased by 50% to allow patient and family to feel comfortable with self care of his wounds.    Time 4   Period Weeks   Status On-going             Patient will benefit from skilled therapeutic intervention in order to improve the following deficits and impairments:     Visit Diagnosis: Bilateral low back pain without sciatica  Pain in right forearm  Stiffness of right hand, not elsewhere classified  Laceration of right knee with foreign body, subsequent encounter  Laceration of left knee with foreign body, subsequent encounter  Laceration of right elbow with foreign body, subsequent encounter  Open wound of lumbar region without complication, subsequent encounter  Pain in left forearm  Stiffness of left elbow, not elsewhere classified     Problem List There are no active problems to display for this patient.  [address] LPTA; CBIS [phone]  Aldona Lento 09/14/2015, 4:45 PM   PHYSICAL THERAPY DISCHARGE SUMMARY  Visits from Start of Care: 7  Current functional level related to goals / functional outcomes:  Wounds have progressed to a level where patient/mother can care for them; no further skilled wound services needed. DC today.    Remaining deficits: Healing wounds    Education / Equipment: Proper wound care strategies  Plan: Patient agrees to discharge.  Patient goals were partially met. Patient is being discharged due to being pleased with the current functional level.  ?????      Deniece Ree PT, DPT [facility] [address] [place], [place], [address] Phone: [phone]   Fax:  [phone]  Name: Mitchell Hopkins MRN: [id number] Date of Birth: 08-29-1985

## 2015-09-18 ENCOUNTER — Ambulatory Visit (HOSPITAL_COMMUNITY): Payer: BLUE CROSS/BLUE SHIELD | Admitting: Physical Therapy

## 2015-09-19 ENCOUNTER — Ambulatory Visit (INDEPENDENT_AMBULATORY_CARE_PROVIDER_SITE_OTHER): Payer: BLUE CROSS/BLUE SHIELD

## 2015-09-19 ENCOUNTER — Encounter: Payer: Self-pay | Admitting: Orthopaedic Surgery

## 2015-09-19 ENCOUNTER — Ambulatory Visit (INDEPENDENT_AMBULATORY_CARE_PROVIDER_SITE_OTHER): Payer: BLUE CROSS/BLUE SHIELD | Admitting: Orthopaedic Surgery

## 2015-09-19 VITALS — BP 138/90 | HR 93 | Ht 71.0 in | Wt 233.0 lb

## 2015-09-19 DIAGNOSIS — S62101D Fracture of unspecified carpal bone, right wrist, subsequent encounter for fracture with routine healing: Secondary | ICD-10-CM

## 2015-09-19 DIAGNOSIS — S92902D Unspecified fracture of left foot, subsequent encounter for fracture with routine healing: Secondary | ICD-10-CM
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
## 2015-09-19 NOTE — Patient Instructions (Signed)
Stay out of work another two weeks.  X-rays on return.

## 2015-09-20 ENCOUNTER — Ambulatory Visit (HOSPITAL_COMMUNITY): Payer: BLUE CROSS/BLUE SHIELD

## 2015-09-22 ENCOUNTER — Ambulatory Visit (HOSPITAL_COMMUNITY): Payer: BLUE CROSS/BLUE SHIELD

## 2015-09-25 ENCOUNTER — Ambulatory Visit (HOSPITAL_COMMUNITY): Payer: BLUE CROSS/BLUE SHIELD | Admitting: Physical Therapy

## 2015-09-27 ENCOUNTER — Ambulatory Visit (HOSPITAL_COMMUNITY): Payer: BLUE CROSS/BLUE SHIELD

## 2015-09-29 ENCOUNTER — Ambulatory Visit (HOSPITAL_COMMUNITY): Payer: BLUE CROSS/BLUE SHIELD

## 2015-10-02 ENCOUNTER — Ambulatory Visit (HOSPITAL_COMMUNITY): Payer: BLUE CROSS/BLUE SHIELD | Admitting: Physical Therapy

## 2015-10-03 ENCOUNTER — Encounter: Payer: Self-pay | Admitting: Orthopaedic Surgery

## 2015-10-03 ENCOUNTER — Ambulatory Visit (INDEPENDENT_AMBULATORY_CARE_PROVIDER_SITE_OTHER): Payer: BLUE CROSS/BLUE SHIELD | Admitting: Orthopaedic Surgery

## 2015-10-03 ENCOUNTER — Ambulatory Visit (INDEPENDENT_AMBULATORY_CARE_PROVIDER_SITE_OTHER): Payer: BLUE CROSS/BLUE SHIELD

## 2015-10-03 DIAGNOSIS — S92902D Unspecified fracture of left foot, subsequent encounter for fracture with routine healing: Secondary | ICD-10-CM

## 2015-10-03 DIAGNOSIS — S62101D Fracture of unspecified carpal bone, right wrist, subsequent encounter for fracture with routine healing: Secondary | ICD-10-CM

## 2015-10-03 NOTE — Progress Notes (Signed)
CC:  I am much better  His right wrist and left foot are better. He has no pain.  His skin wounds have healed.  NV intact.  ROM of the right wrist lacks about 5 to 10 degrees of full supination.  Encounter Diagnoses  Name Primary?  . Wrist fracture, right, with routine healing, subsequent encounter Yes  . Foot fracture, left, with routine healing, subsequent encounter    Return in three weeks.  Return to work September 28th.  Call if any problem.  Precautions discussed.  Electronically Signed Darreld McleanWayne Denessa Cavan, MD 9/26/201710:54 AM

## 2015-10-03 NOTE — Patient Instructions (Addendum)
Return to work 10/05/15 light duty

## 2015-10-04 ENCOUNTER — Ambulatory Visit (HOSPITAL_COMMUNITY): Payer: BLUE CROSS/BLUE SHIELD | Admitting: Physical Therapy

## 2015-10-06 ENCOUNTER — Ambulatory Visit (HOSPITAL_COMMUNITY): Payer: BLUE CROSS/BLUE SHIELD | Admitting: Physical Therapy

## 2015-10-09 ENCOUNTER — Ambulatory Visit (HOSPITAL_COMMUNITY): Payer: BLUE CROSS/BLUE SHIELD | Admitting: Physical Therapy

## 2015-10-11 ENCOUNTER — Ambulatory Visit (HOSPITAL_COMMUNITY): Payer: BLUE CROSS/BLUE SHIELD | Admitting: Physical Therapy

## 2015-10-13 ENCOUNTER — Ambulatory Visit (HOSPITAL_COMMUNITY): Payer: BLUE CROSS/BLUE SHIELD

## 2015-10-16 ENCOUNTER — Ambulatory Visit (HOSPITAL_COMMUNITY): Payer: BLUE CROSS/BLUE SHIELD | Admitting: Physical Therapy

## 2015-10-18 ENCOUNTER — Ambulatory Visit (HOSPITAL_COMMUNITY): Payer: BLUE CROSS/BLUE SHIELD | Admitting: Physical Therapy

## 2015-10-20 ENCOUNTER — Ambulatory Visit (HOSPITAL_COMMUNITY): Payer: BLUE CROSS/BLUE SHIELD

## 2015-10-23 ENCOUNTER — Ambulatory Visit (HOSPITAL_COMMUNITY): Payer: BLUE CROSS/BLUE SHIELD | Admitting: Physical Therapy

## 2015-10-24 ENCOUNTER — Ambulatory Visit: Payer: BLUE CROSS/BLUE SHIELD | Admitting: Orthopaedic Surgery

## 2015-10-25 ENCOUNTER — Ambulatory Visit (HOSPITAL_COMMUNITY): Payer: BLUE CROSS/BLUE SHIELD | Admitting: Physical Therapy

## 2015-10-25 ENCOUNTER — Ambulatory Visit (INDEPENDENT_AMBULATORY_CARE_PROVIDER_SITE_OTHER): Payer: BLUE CROSS/BLUE SHIELD | Admitting: Orthopaedic Surgery

## 2015-10-25 ENCOUNTER — Encounter: Payer: Self-pay | Admitting: Orthopaedic Surgery

## 2015-10-25 VITALS — BP 137/80 | HR 96 | Temp 98.4°F | Ht 71.0 in | Wt 239.0 lb

## 2015-10-25 DIAGNOSIS — S92902D Unspecified fracture of left foot, subsequent encounter for fracture with routine healing: Secondary | ICD-10-CM

## 2015-10-25 DIAGNOSIS — S62101D Fracture of unspecified carpal bone, right wrist, subsequent encounter for fracture with routine healing: Secondary | ICD-10-CM
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
## 2015-10-27 ENCOUNTER — Ambulatory Visit (HOSPITAL_COMMUNITY): Payer: BLUE CROSS/BLUE SHIELD | Admitting: Physical Therapy
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
